# Patient Record
Sex: Female | Born: 1980 | Race: White | Hispanic: No | Marital: Married | Smoking: Never smoker
Health system: Southern US, Community
[De-identification: ages and names within clinical notes are randomized; demographics above are authoritative.]

## PROBLEM LIST (undated history)

## (undated) ENCOUNTER — Inpatient Hospital Stay (HOSPITAL_COMMUNITY): Payer: Self-pay

## (undated) DIAGNOSIS — F419 Anxiety disorder, unspecified: Secondary | ICD-10-CM

## (undated) DIAGNOSIS — R87629 Unspecified abnormal cytological findings in specimens from vagina: Secondary | ICD-10-CM

## (undated) HISTORY — DX: Unspecified abnormal cytological findings in specimens from vagina: R87.629

## (undated) HISTORY — PX: OTHER SURGICAL HISTORY: SHX169

## (undated) HISTORY — PX: DILATION AND CURETTAGE OF UTERUS: SHX78

## (undated) HISTORY — DX: Anxiety disorder, unspecified: F41.9

---

## 2001-08-25 ENCOUNTER — Emergency Department (HOSPITAL_COMMUNITY): Admission: EM | Admit: 2001-08-25 | Discharge: 2001-08-25 | Payer: Self-pay | Admitting: Emergency Medicine

## 2002-02-06 ENCOUNTER — Emergency Department (HOSPITAL_COMMUNITY): Admission: EM | Admit: 2002-02-06 | Discharge: 2002-02-07 | Payer: Self-pay | Admitting: Emergency Medicine

## 2002-02-07 ENCOUNTER — Encounter: Payer: Self-pay | Admitting: Emergency Medicine

## 2002-02-09 ENCOUNTER — Emergency Department (HOSPITAL_COMMUNITY): Admission: EM | Admit: 2002-02-09 | Discharge: 2002-02-09 | Payer: Self-pay | Admitting: *Deleted

## 2002-03-30 ENCOUNTER — Ambulatory Visit (HOSPITAL_COMMUNITY): Admission: RE | Admit: 2002-03-30 | Discharge: 2002-03-30 | Payer: Self-pay

## 2002-04-15 ENCOUNTER — Emergency Department (HOSPITAL_COMMUNITY): Admission: EM | Admit: 2002-04-15 | Discharge: 2002-04-15 | Payer: Self-pay | Admitting: Emergency Medicine

## 2002-11-13 ENCOUNTER — Inpatient Hospital Stay (HOSPITAL_COMMUNITY): Admission: EM | Admit: 2002-11-13 | Discharge: 2002-11-13 | Payer: Self-pay | Admitting: Emergency Medicine

## 2004-03-22 ENCOUNTER — Emergency Department (HOSPITAL_COMMUNITY): Admission: EM | Admit: 2004-03-22 | Discharge: 2004-03-22 | Payer: Self-pay | Admitting: Emergency Medicine

## 2007-01-13 ENCOUNTER — Emergency Department (HOSPITAL_COMMUNITY): Admission: EM | Admit: 2007-01-13 | Discharge: 2007-01-13 | Payer: Self-pay | Admitting: Emergency Medicine

## 2007-02-01 ENCOUNTER — Other Ambulatory Visit: Admission: RE | Admit: 2007-02-01 | Discharge: 2007-02-01 | Payer: Self-pay | Admitting: Gynecology

## 2008-04-23 ENCOUNTER — Emergency Department (HOSPITAL_COMMUNITY): Admission: EM | Admit: 2008-04-23 | Discharge: 2008-04-23 | Payer: Self-pay | Admitting: Emergency Medicine

## 2008-12-06 ENCOUNTER — Emergency Department (HOSPITAL_COMMUNITY): Admission: EM | Admit: 2008-12-06 | Discharge: 2008-12-06 | Payer: Self-pay | Admitting: Family Medicine

## 2009-07-04 ENCOUNTER — Inpatient Hospital Stay (HOSPITAL_COMMUNITY): Admission: AD | Admit: 2009-07-04 | Discharge: 2009-07-05 | Payer: Self-pay | Admitting: Obstetrics and Gynecology

## 2009-11-25 ENCOUNTER — Inpatient Hospital Stay (HOSPITAL_COMMUNITY): Admission: AD | Admit: 2009-11-25 | Discharge: 2009-11-25 | Payer: Self-pay | Admitting: Obstetrics and Gynecology

## 2009-11-28 ENCOUNTER — Inpatient Hospital Stay (HOSPITAL_COMMUNITY): Admission: AD | Admit: 2009-11-28 | Discharge: 2009-11-28 | Payer: Self-pay | Admitting: Obstetrics and Gynecology

## 2010-01-09 ENCOUNTER — Inpatient Hospital Stay (HOSPITAL_COMMUNITY): Admission: RE | Admit: 2010-01-09 | Discharge: 2010-01-11 | Payer: Self-pay | Admitting: Obstetrics and Gynecology

## 2010-12-24 LAB — RPR: RPR Ser Ql: NONREACTIVE

## 2010-12-24 LAB — URINALYSIS, ROUTINE W REFLEX MICROSCOPIC
Bilirubin Urine: NEGATIVE
Bilirubin Urine: NEGATIVE
Glucose, UA: NEGATIVE mg/dL
Glucose, UA: NEGATIVE mg/dL
Hgb urine dipstick: NEGATIVE
Hgb urine dipstick: NEGATIVE
Ketones, ur: NEGATIVE mg/dL
Ketones, ur: NEGATIVE mg/dL
Nitrite: NEGATIVE
Protein, ur: NEGATIVE mg/dL
Protein, ur: NEGATIVE mg/dL
Specific Gravity, Urine: 1.02 (ref 1.005–1.030)
Urobilinogen, UA: 0.2 mg/dL (ref 0.0–1.0)
pH: 6.5 (ref 5.0–8.0)
pH: 6.5 (ref 5.0–8.0)

## 2010-12-24 LAB — CBC
HCT: 28.6 % — ABNORMAL LOW (ref 36.0–46.0)
HCT: 33.7 % — ABNORMAL LOW (ref 36.0–46.0)
Hemoglobin: 11.1 g/dL — ABNORMAL LOW (ref 12.0–15.0)
Hemoglobin: 9.4 g/dL — ABNORMAL LOW (ref 12.0–15.0)
MCHC: 32.8 g/dL (ref 30.0–36.0)
MCHC: 32.9 g/dL (ref 30.0–36.0)
MCV: 79.1 fL (ref 78.0–100.0)
MCV: 79.2 fL (ref 78.0–100.0)
Platelets: 215 10*3/uL (ref 150–400)
Platelets: 259 10*3/uL (ref 150–400)
RBC: 3.62 MIL/uL — ABNORMAL LOW (ref 3.87–5.11)
RBC: 4.26 MIL/uL (ref 3.87–5.11)
RDW: 15.4 % (ref 11.5–15.5)
RDW: 16 % — ABNORMAL HIGH (ref 11.5–15.5)
WBC: 10.7 10*3/uL — ABNORMAL HIGH (ref 4.0–10.5)
WBC: 9.8 10*3/uL (ref 4.0–10.5)

## 2010-12-24 LAB — WET PREP, GENITAL
Clue Cells Wet Prep HPF POC: NONE SEEN
Trich, Wet Prep: NONE SEEN
Yeast Wet Prep HPF POC: NONE SEEN

## 2010-12-24 LAB — STREP B DNA PROBE

## 2010-12-24 LAB — URINE CULTURE: Colony Count: NO GROWTH

## 2010-12-24 LAB — URINE MICROSCOPIC-ADD ON

## 2010-12-24 LAB — FETAL FIBRONECTIN: Fetal Fibronectin: POSITIVE — AB

## 2011-01-09 LAB — WET PREP, GENITAL
Clue Cells Wet Prep HPF POC: NONE SEEN
Trich, Wet Prep: NONE SEEN
Yeast Wet Prep HPF POC: NONE SEEN

## 2011-01-15 LAB — POCT RAPID STREP A (OFFICE): Streptococcus, Group A Screen (Direct): POSITIVE — AB

## 2011-02-20 NOTE — Discharge Summary (Signed)
Destiny Joseph, Destiny Joseph                          ACCOUNT NO.:  [id number]   MEDICAL RECORD NO.:  [id number]                   PATIENT TYPE:  INP   LOCATION:  0154                                 FACILITY:  [facility]   PHYSICIAN:  Lazaro Arms, M.D.        DATE OF BIRTH:  [birth year]   DATE OF ADMISSION:  11/13/2002  DATE OF DISCHARGE:  11/13/2002                                 DISCHARGE SUMMARY   DISCHARGE DIAGNOSES:  1. 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  2. Status post suicide attempt.  3. History of depression.   HISTORY OF PRESENT ILLNESS:  The patient presented to the emergency room on  the morning of November 13, 2002, with nausea, vomiting, and increased  abdominal pain.  This had been progressively worse over the past 40 hours.  In the emergency room she admitted to taking over #30 Tylenol because she  was mad at the world.  She had had this ingestion on the evening of November 10, 2002.  In the emergency room she was hemodynamically stable and alert and  oriented x3; however, her initial laboratory revealed platelets of 56 and a  PT that was greater than 90, and SGOT which was 1713, SGPT was 1680.  Her  pregnancy test was within normal limits.  Her acetaminophen level was less  than 10.   HOSPITAL COURSE:  She was admitted to the intensive care unit and started on  Mucomyst.  We then proceeded to spend greater than one hour arranging her  transport to a facility that has the availability of liver transplant.  This  was finally accomplished, and the patient will be transported to [facility] under the care of Dr. Bobbe Medico.   PROCEDURE:  During her hospitalization included a transfusion of several  units of SFP.  Immediately after transfusion of these, her INR was 1.2.   PAST MEDICAL HISTORY:  1. Depression:  The patient denies any history of prior suicide attempts or     drug use.  2. She has a history of abdominal pain, and  has had an endoscopy in the past     by Dr. Fayrene Fearing L. Edwards, which showed a healing gastric ulcer and     gastroesophageal reflux disease.   MEDICATIONS:  She was on no medicines prior to this.   DISCHARGE PLAN:  1. The patient is to be transferred by ambulance to [facility] under     the care of Dr. Bobbe Medico.  2.     We will continue the Mucomyst q.4h. per protocol.  Much time was spent in discussion with the family, as well as with several  GI specialists.  The parents were made aware of her grave prognosis.  Lazaro Arms, M.D.    AMC/MEDQ  D:  11/13/2002  T:  11/13/2002  Job:  [id number]   cc:   Fayrene Fearing L. Malon Kindle., M.D.  [address]  [place]  [place] [address]  Fax: [phone]   [facility]

## 2011-02-20 NOTE — Procedures (Signed)
[place]  Patient:    Destiny Joseph, CHICO Visit Number: [id number] MRN: [id number]          Service Type: END Location: ENDO Attending Physician:  Orland Mustard Dictated by:   Llana Aliment. Randa Evens, M.D. Proc. Date: 03/30/02 Admit Date:  03/30/2002 Discharge Date: 03/30/2002   CC:         Dr. Selinda Flavin at [facility] in [place], [place]   Procedure Report  DATE OF BIRTH:  Jul 21, 1981  PROCEDURE:  Esophagogastroduodenoscopy with biopsy.  SURGEON:  James L. Randa Evens, M.D.  MEDICATIONS:  Cetacaine spray, fentanyl 50 mcg, and Versed 6 mg IV.  INDICATIONS:  Persistent epigastric pain and going through to the back, negative ultrasound.  The pain has been refractory to Nexium.  DESCRIPTION OF PROCEDURE:  The procedure had been explained to the patient and consent obtained.  With the patient in the left lateral decubitus position, the Olympus scope was inserted, and advanced under direct visualization.  The stomach was entered.  The pylorus identified and passed.  The duodenum was seen well down to the second and third portion and was also normal.  No ulceration or inflammation.  Duodenal bulb normal.  The pyloric channel normal.  The antrum was inflamed with areas of streaky gastritis, and right near the antrum was what appeared to be healing ulcer nearly healed, still somewhat inflamed, but ulcer crater is completely healed over.  A biopsy was taken of the antrum for rapid urease test for Helicobacter.  The fundus and cardia of the stomach was seen well on retroflexed and were normal.  The distal esophagus was somewhat red.  The scope was withdrawn.  The patient tolerated the procedure well.  ASSESSMENT: 1. Gastritis and probable healed gastric ulcer. 2. Probable gastroesophageal reflux disease.  PLAN:  Will continue on Nexium for now.  Will add Reglan 5 mg a.c. and h.s. Will warn about possible jitteriness and sleepiness.   We will give her a reflux sheet and we will see back in the office in three to four weeks. Dictated by:   Llana Aliment. Randa Evens, M.D. Attending Physician:  Orland Mustard DD:  03/30/02 TD:  04/01/02 Job: [id number]

## 2011-07-03 LAB — POCT RAPID STREP A: Streptococcus, Group A Screen (Direct): NEGATIVE

## 2011-09-11 ENCOUNTER — Encounter: Payer: Self-pay | Admitting: *Deleted

## 2011-09-11 ENCOUNTER — Emergency Department (HOSPITAL_BASED_OUTPATIENT_CLINIC_OR_DEPARTMENT_OTHER)
Admission: EM | Admit: 2011-09-11 | Discharge: 2011-09-12 | Disposition: A | Discharge status: Home / Self Care | Payer: 59 | Attending: Emergency Medicine | Admitting: Emergency Medicine

## 2011-09-11 ENCOUNTER — Emergency Department (INDEPENDENT_AMBULATORY_CARE_PROVIDER_SITE_OTHER): Payer: 59

## 2011-09-11 DIAGNOSIS — M7989 Other specified soft tissue disorders: Secondary | ICD-10-CM

## 2011-09-11 DIAGNOSIS — M25549 Pain in joints of unspecified hand: Secondary | ICD-10-CM

## 2011-09-11 DIAGNOSIS — S6710XA Crushing injury of unspecified finger(s), initial encounter: Secondary | ICD-10-CM

## 2011-09-11 DIAGNOSIS — W2209XA Striking against other stationary object, initial encounter: Secondary | ICD-10-CM

## 2011-09-11 DIAGNOSIS — S6010XA Contusion of unspecified finger with damage to nail, initial encounter: Secondary | ICD-10-CM

## 2011-09-11 DIAGNOSIS — IMO0002 Reserved for concepts with insufficient information to code with codable children: Secondary | ICD-10-CM | POA: Insufficient documentation

## 2011-09-11 DIAGNOSIS — S6000XA Contusion of unspecified finger without damage to nail, initial encounter: Secondary | ICD-10-CM

## 2011-09-11 NOTE — ED Notes (Signed)
Pt accidentally slammed her right index finger in the car door tonight. Bruising noted. States pain is severe, which prompted her to come to ER. Taking tylenol/ibuprofen without relief.

## 2011-09-12 MED ORDER — IBUPROFEN 400 MG PO TABS
400.0000 mg | ORAL_TABLET | Freq: Once | ORAL | Status: AC
  Administered 2011-09-12: 400 mg via ORAL
  Filled 2011-09-12: qty 1

## 2011-09-12 MED ORDER — TRAMADOL HCL 50 MG PO TABS: 50.0000 mg | ORAL_TABLET | Freq: Four times a day (QID) | ORAL | Status: AC | PRN

## 2011-09-12 NOTE — ED Notes (Signed)
Dr. Kohut at bedside 

## 2011-09-18 NOTE — ED Provider Notes (Signed)
History    30yf with finger pain. r index finger. Distal aspect. Onset this evening. Acute when slammed in car door. Persistent pain since. Throbbing pain. Constant with no radiation. No numbness or tingling. Denies pain anywhere else. CSN: [id number] Arrival date & time: 09/11/2011 11:37 PM   First MD Initiated Contact with Patient 09/12/11 0226      Chief Complaint  Patient presents with  . Hand Pain    (Consider location/radiation/quality/duration/timing/severity/associated sxs/prior treatment) HPI  History reviewed. No pertinent past medical history.  Past Surgical History  Procedure Date  . Dilation and curettage of uterus     No family history on file.  History  Substance Use Topics  . Smoking status: Never Smoker   . Smokeless tobacco: Not on file  . Alcohol Use: No    OB History    Grav Para Term Preterm Abortions TAB SAB Ect Mult Living                  Review of Systems   Review of symptoms negative unless otherwise noted in HPI.   Allergies  Review of patient's allergies indicates no known allergies.  Home Medications   Current Outpatient Rx  Name Route Sig Dispense Refill  . TRAMADOL HCL 50 MG PO TABS Oral Take 1 tablet (50 mg total) by mouth every 6 (six) hours as needed for pain. Maximum dose= 8 tablets per day 10 tablet 0    BP 126/77  Pulse 58  Temp(Src) 98.2 F (36.8 C) (Oral)  Resp 16  Ht 5\' 8"  (1.727 m)  Wt 230 lb (104.327 kg)  BMI 34.97 kg/m2  SpO2 99%  LMP 09/03/2011  Physical Exam  Nursing note and vitals reviewed. Constitutional: She appears well-developed and well-nourished. No distress.  HENT:  Head: Normocephalic and atraumatic.  Eyes: Conjunctivae are normal. Right eye exhibits no discharge. Left eye exhibits no discharge.  Neck: Neck supple.  Cardiovascular: Normal rate, regular rhythm and normal heart sounds.  Exam reveals no gallop and no friction rub.   No murmur heard. Pulmonary/Chest: Effort normal and breath  sounds normal. No respiratory distress.  Musculoskeletal:       r index finger nail with small subungual hematoma. Moderate diffuse tenderness of distal phalanx. Skin intact. No deformity. Good cap refill distally. Sensation intatact to light touch. Flexor/extensor function against resistance intact at DIP.  Neurological: She is alert.  Skin: Skin is warm and dry.  Psychiatric: She has a normal mood and affect. Her behavior is normal. Thought content normal.    ED Course  Procedures (including critical care time)  Labs Reviewed - No data to display No results found.  Dg Finger Index Right  09/11/2011  *RADIOLOGY REPORT*  Clinical Data: Crush injury to the right index finger, closed in a car door.  RIGHT INDEX FINGER 2+V 09/11/2011:  Comparison: None.  Findings: No evidence of acute or subacute fracture or dislocation. Well-preserved joint spaces.  Well-preserved bone mineral density. No intrinsic osseous abnormalities.  Mild soft tissue swelling.  IMPRESSION: No osseous abnormality.  Original Report Authenticated By: Arnell Sieving, M.D.   1. Contusion, finger   2. Subungual hematoma of finger       MDM  30yF with finger pain after being slammed in car door. Consider fracture, contusion, dislocation. XR neg for acute osseous injury. Small subungal hematoma. Do not feels needs trephination at this time. Plan symptomatic tx and pcp fu.        Raeford Razor, MD 09/18/11  1459 

## 2013-07-23 IMAGING — CR DG FINGER INDEX 2+V*R*
3 series · 3 of 3 positions shown · non-contrast
Comparison: None.

CLINICAL DATA: Crush injury to the right index finger, closed in a
car door.

RIGHT INDEX FINGER 2+V 09/11/2011:

[x finger pa right]
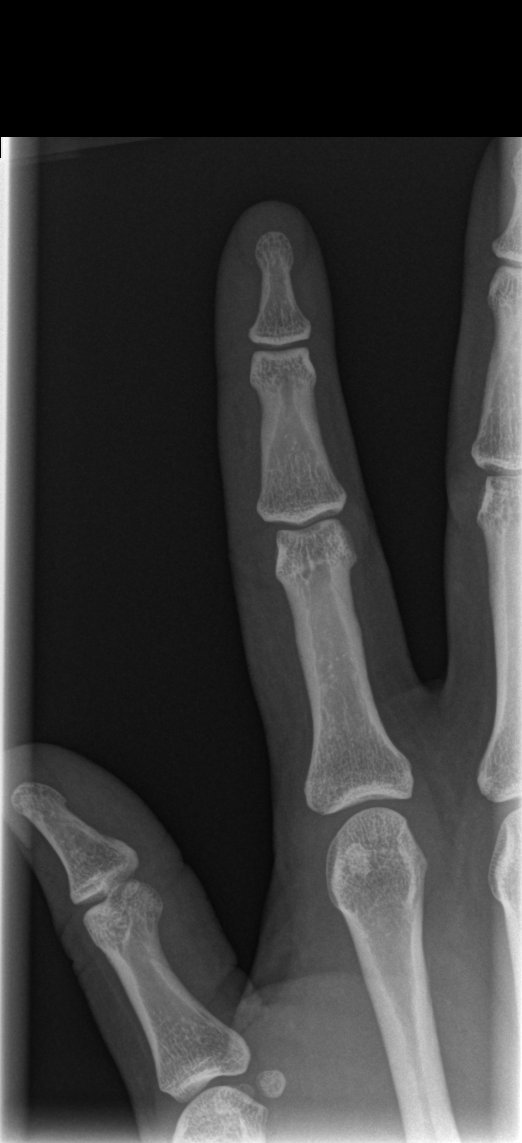

[x finger obl. right]
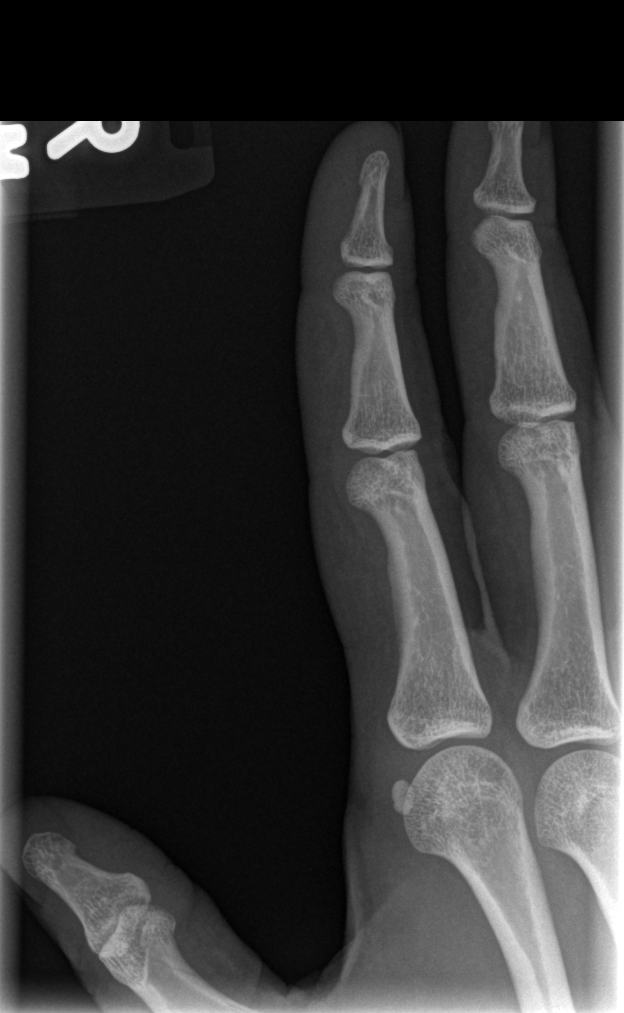

[x finger lateral right]
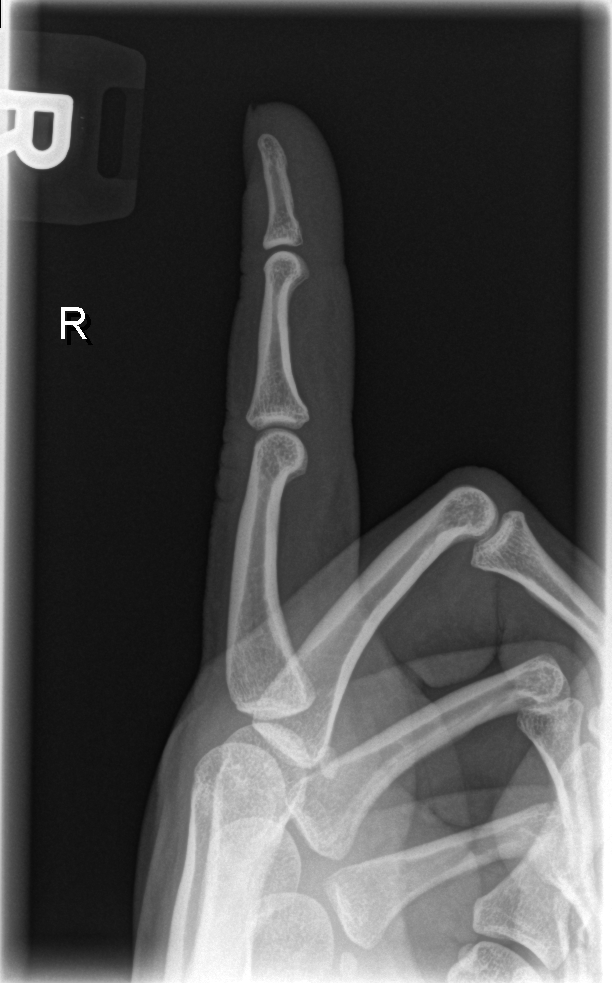

[3 of 3 positions shown; findings below may reference images not displayed]

FINDINGS: No evidence of acute or subacute fracture or dislocation.
Well-preserved joint spaces.  Well-preserved bone mineral density.
No intrinsic osseous abnormalities.  Mild soft tissue swelling.
IMPRESSION: No osseous abnormality.

## 2016-10-05 NOTE — L&D Delivery Note (Signed)
Patient is 36 y.o. [id number] 9095w0d admitted for IOL for macrosomia. S/p IOL with pitocin.  SROM at 0500.    Delivery Note At 5:05 PM a viable female was delivered via Vaginal, Spontaneous Delivery (Presentation: ROA).  APGAR: 8, 9; weight pending.   Placenta status: spontaneous, intact.  Cord: 3 vessel  Anesthesia:  Epidural Episiotomy:  N/A Lacerations:  None Suture Repair: None Est. Blood Loss (mL):  100  Mom to postpartum.  Baby to Couplet care / Skin to Skin.    Upon arrival patient was complete and pushing. She pushed with good maternal effort to deliver a viable female infant in cephalic, ROA position. No nuchal cord present. Baby delivered without difficulty (anterior shoulder delivered with ease), was noted to have good tone and placed on maternal abdomen for oral suctioning, drying and stimulation. Delayed cord clamping performed. Placenta delivered spontaneously with gentle cord traction. Fundus firm with massage and Pitocin. Perineum inspected and found to have no lacerations.  Counts of sharps, instruments, and lap pads were all correct.   Larene BeachMary K Lilibeth Opie, DO PGY-2 06/12/2017, 5:21 PM

## 2016-11-16 ENCOUNTER — Encounter: Payer: Self-pay | Admitting: Family

## 2016-11-16 ENCOUNTER — Other Ambulatory Visit (HOSPITAL_COMMUNITY)
Admission: RE | Admit: 2016-11-16 | Discharge: 2016-11-16 | Disposition: A | Discharge status: Home / Self Care | Payer: Managed Care, Other (non HMO) | Source: Ambulatory Visit | Attending: Family | Admitting: Family

## 2016-11-16 ENCOUNTER — Ambulatory Visit (INDEPENDENT_AMBULATORY_CARE_PROVIDER_SITE_OTHER): Payer: Managed Care, Other (non HMO) | Admitting: Family

## 2016-11-16 VITALS — BP 99/53 | HR 78 | Wt 253.0 lb

## 2016-11-16 DIAGNOSIS — Z1151 Encounter for screening for human papillomavirus (HPV): Secondary | ICD-10-CM | POA: Diagnosis not present

## 2016-11-16 DIAGNOSIS — Z8742 Personal history of other diseases of the female genital tract: Secondary | ICD-10-CM

## 2016-11-16 DIAGNOSIS — Z124 Encounter for screening for malignant neoplasm of cervix: Secondary | ICD-10-CM | POA: Diagnosis not present

## 2016-11-16 DIAGNOSIS — Z283 Underimmunization status: Secondary | ICD-10-CM

## 2016-11-16 DIAGNOSIS — Z87898 Personal history of other specified conditions: Secondary | ICD-10-CM

## 2016-11-16 DIAGNOSIS — O9989 Other specified diseases and conditions complicating pregnancy, childbirth and the puerperium: Secondary | ICD-10-CM

## 2016-11-16 DIAGNOSIS — Z113 Encounter for screening for infections with a predominantly sexual mode of transmission: Secondary | ICD-10-CM | POA: Diagnosis present

## 2016-11-16 DIAGNOSIS — Z3689 Encounter for other specified antenatal screening: Secondary | ICD-10-CM | POA: Diagnosis not present

## 2016-11-16 DIAGNOSIS — O09521 Supervision of elderly multigravida, first trimester: Secondary | ICD-10-CM | POA: Diagnosis not present

## 2016-11-16 DIAGNOSIS — Z01411 Encounter for gynecological examination (general) (routine) with abnormal findings: Secondary | ICD-10-CM | POA: Diagnosis present

## 2016-11-16 DIAGNOSIS — Z3481 Encounter for supervision of other normal pregnancy, first trimester: Secondary | ICD-10-CM

## 2016-11-16 DIAGNOSIS — Z2839 Other underimmunization status: Secondary | ICD-10-CM

## 2016-11-16 DIAGNOSIS — J309 Allergic rhinitis, unspecified: Secondary | ICD-10-CM

## 2016-11-16 DIAGNOSIS — O09529 Supervision of elderly multigravida, unspecified trimester: Secondary | ICD-10-CM | POA: Insufficient documentation

## 2016-11-16 DIAGNOSIS — Z3490 Encounter for supervision of normal pregnancy, unspecified, unspecified trimester: Secondary | ICD-10-CM | POA: Insufficient documentation

## 2016-11-16 DIAGNOSIS — Z3480 Encounter for supervision of other normal pregnancy, unspecified trimester: Secondary | ICD-10-CM

## 2016-11-16 DIAGNOSIS — D069 Carcinoma in situ of cervix, unspecified: Secondary | ICD-10-CM | POA: Insufficient documentation

## 2016-11-16 LAB — HEMOGLOBIN A1C
HEMOGLOBIN A1C: 5 % (ref <5.7)
MEAN PLASMA GLUCOSE: 97 mg/dL

## 2016-11-16 LAB — GLUCOSE, RANDOM: GLUCOSE: 116 mg/dL — AB (ref 65–99)

## 2016-11-16 MED ORDER — LORATADINE 10 MG PO TABS: 10.0000 mg | ORAL_TABLET | Freq: Every day | ORAL | 2 refills | Status: DC

## 2016-11-16 NOTE — Patient Instructions (Signed)
First Trimester of Pregnancy  The first trimester of pregnancy is from week 1 until the end of week 12 (months 1 through 3). A week after a sperm fertilizes an egg, the egg will implant on the wall of the uterus. This embryo will begin to develop into a baby. Genes from you and your partner are forming the baby. The female genes determine whether the baby is a boy or a girl. At 6-8 weeks, the eyes and face are formed, and the heartbeat can be seen on ultrasound. At the end of 12 weeks, all the baby's organs are formed.   Now that you are pregnant, you will want to do everything you can to have a healthy baby. Two of the most important things are to get good prenatal care and to follow your health care provider's instructions. Prenatal care is all the medical care you receive before the baby's birth. This care will help prevent, find, and treat any problems during the pregnancy and childbirth.  BODY CHANGES  Your body goes through many changes during pregnancy. The changes vary from woman to woman.   · You may gain or lose a couple of pounds at first.  · You may feel sick to your stomach (nauseous) and throw up (vomit). If the vomiting is uncontrollable, call your health care provider.  · You may tire easily.  · You may develop headaches that can be relieved by medicines approved by your health care provider.  · You may urinate more often. Painful urination may mean you have a bladder infection.  · You may develop heartburn as a result of your pregnancy.  · You may develop constipation because certain hormones are causing the muscles that push waste through your intestines to slow down.  · You may develop hemorrhoids or swollen, bulging veins (varicose veins).  · Your breasts may begin to grow larger and become tender. Your nipples may stick out more, and the tissue that surrounds them (areola) may become darker.  · Your gums may bleed and may be sensitive to brushing and flossing.   · Dark spots or blotches (chloasma, mask of pregnancy) may develop on your face. This will likely fade after the baby is born.  · Your menstrual periods will stop.  · You may have a loss of appetite.  · You may develop cravings for certain kinds of food.  · You may have changes in your emotions from day to day, such as being excited to be pregnant or being concerned that something may go wrong with the pregnancy and baby.  · You may have more vivid and strange dreams.  · You may have changes in your hair. These can include thickening of your hair, rapid growth, and changes in texture. Some women also have hair loss during or after pregnancy, or hair that feels dry or thin. Your hair will most likely return to normal after your baby is born.  WHAT TO EXPECT AT YOUR PRENATAL VISITS  During a routine prenatal visit:  · You will be weighed to make sure you and the baby are growing normally.  · Your blood pressure will be taken.  · Your abdomen will be measured to track your baby's growth.  · The fetal heartbeat will be listened to starting around week 10 or 12 of your pregnancy.  · Test results from any previous visits will be discussed.  Your health care provider may ask you:  · How you are feeling.  · If you   are feeling the baby move.  · If you have had any abnormal symptoms, such as leaking fluid, bleeding, severe headaches, or abdominal cramping.  · If you are using any tobacco products, including cigarettes, chewing tobacco, and electronic cigarettes.  · If you have any questions.  Other tests that may be performed during your first trimester include:  · Blood tests to find your blood type and to check for the presence of any previous infections. They will also be used to check for low iron levels (anemia) and Rh antibodies. Later in the pregnancy, blood tests for diabetes will be done along with other tests if problems develop.  · Urine tests to check for infections, diabetes, or protein in the urine.   · An ultrasound to confirm the proper growth and development of the baby.  · An amniocentesis to check for possible genetic problems.  · Fetal screens for spina bifida and Down syndrome.  · You may need other tests to make sure you and the baby are doing well.  · HIV (human immunodeficiency virus) testing. Routine prenatal testing includes screening for HIV, unless you choose not to have this test.  HOME CARE INSTRUCTIONS   Medicines  · Follow your health care provider's instructions regarding medicine use. Specific medicines may be either safe or unsafe to take during pregnancy.  · Take your prenatal vitamins as directed.  · If you develop constipation, try taking a stool softener if your health care provider approves.  Diet  · Eat regular, well-balanced meals. Choose a variety of foods, such as meat or vegetable-based protein, fish, milk and low-fat dairy products, vegetables, fruits, and whole grain breads and cereals. Your health care provider will help you determine the amount of weight gain that is right for you.  · Avoid raw meat and uncooked cheese. These carry germs that can cause birth defects in the baby.  · Eating four or five small meals rather than three large meals a day may help relieve nausea and vomiting. If you start to feel nauseous, eating a few soda crackers can be helpful. Drinking liquids between meals instead of during meals also seems to help nausea and vomiting.  · If you develop constipation, eat more high-fiber foods, such as fresh vegetables or fruit and whole grains. Drink enough fluids to keep your urine clear or pale yellow.  Activity and Exercise  · Exercise only as directed by your health care provider. Exercising will help you:    Control your weight.    Stay in shape.    Be prepared for labor and delivery.  · Experiencing pain or cramping in the lower abdomen or low back is a good sign that you should stop exercising. Check with your health care provider  before continuing normal exercises.  · Try to avoid standing for long periods of time. Move your legs often if you must stand in one place for a long time.  · Avoid heavy lifting.  · Wear low-heeled shoes, and practice good posture.  · You may continue to have sex unless your health care provider directs you otherwise.  Relief of Pain or Discomfort  · Wear a good support bra for breast tenderness.    · Take warm sitz baths to soothe any pain or discomfort caused by hemorrhoids. Use hemorrhoid cream if your health care provider approves.    · Rest with your legs elevated if you have leg cramps or low back pain.  · If you develop varicose veins in your   legs, wear support hose. Elevate your feet for 15 minutes, 3-4 times a day. Limit salt in your diet.  Prenatal Care  · Schedule your prenatal visits by the twelfth week of pregnancy. They are usually scheduled monthly at first, then more often in the last 2 months before delivery.  · Write down your questions. Take them to your prenatal visits.  · Keep all your prenatal visits as directed by your health care provider.  Safety  · Wear your seat belt at all times when driving.  · Make a list of emergency phone numbers, including numbers for family, friends, the hospital, and police and fire departments.  General Tips  · Ask your health care provider for a referral to a local prenatal education class. Begin classes no later than at the beginning of month 6 of your pregnancy.  · Ask for help if you have counseling or nutritional needs during pregnancy. Your health care provider can offer advice or refer you to specialists for help with various needs.  · Do not use hot tubs, steam rooms, or saunas.  · Do not douche or use tampons or scented sanitary pads.  · Do not cross your legs for long periods of time.  · Avoid cat litter boxes and soil used by cats. These carry germs that can cause birth defects in the baby and possibly loss of the fetus by miscarriage or stillbirth.   · Avoid all smoking, herbs, alcohol, and medicines not prescribed by your health care provider. Chemicals in these affect the formation and growth of the baby.  · Do not use any tobacco products, including cigarettes, chewing tobacco, and electronic cigarettes. If you need help quitting, ask your health care provider. You may receive counseling support and other resources to help you quit.  · Schedule a dentist appointment. At home, brush your teeth with a soft toothbrush and be gentle when you floss.  SEEK MEDICAL CARE IF:   · You have dizziness.  · You have mild pelvic cramps, pelvic pressure, or nagging pain in the abdominal area.  · You have persistent nausea, vomiting, or diarrhea.  · You have a bad smelling vaginal discharge.  · You have pain with urination.  · You notice increased swelling in your face, hands, legs, or ankles.  SEEK IMMEDIATE MEDICAL CARE IF:   · You have a fever.  · You are leaking fluid from your vagina.  · You have spotting or bleeding from your vagina.  · You have severe abdominal cramping or pain.  · You have rapid weight gain or loss.  · You vomit blood or material that looks like coffee grounds.  · You are exposed to German measles and have never had them.  · You are exposed to fifth disease or chickenpox.  · You develop a severe headache.  · You have shortness of breath.  · You have any kind of trauma, such as from a fall or a car accident.     This information is not intended to replace advice given to you by your health care provider. Make sure you discuss any questions you have with your health care provider.     Document Released: 09/15/2001 Document Revised: 10/12/2014 Document Reviewed: 08/01/2013  Elsevier Interactive Patient Education ©2017 [employer]

## 2016-11-16 NOTE — Progress Notes (Signed)
Bedside U/S shows IUP with FHT of 190 BPM  CRL measures 5037w2d  Pt does need pap today.  H/O abn in last pregnancy
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
## 2016-11-17 LAB — PRENATAL PROFILE (SOLSTAS)
Antibody Screen: NEGATIVE
BASOS ABS: 0 {cells}/uL (ref 0–200)
Basophils Relative: 0 %
EOS ABS: 258 {cells}/uL (ref 15–500)
Eosinophils Relative: 3 %
HCT: 38 % (ref 35.0–45.0)
HEP B S AG: NEGATIVE
HIV: NONREACTIVE
Hemoglobin: 12.3 g/dL (ref 11.7–15.5)
LYMPHS ABS: 1806 {cells}/uL (ref 850–3900)
LYMPHS PCT: 21 %
MCH: 27.1 pg (ref 27.0–33.0)
MCHC: 32.4 g/dL (ref 32.0–36.0)
MCV: 83.7 fL (ref 80.0–100.0)
MONO ABS: 430 {cells}/uL (ref 200–950)
MPV: 9 fL (ref 7.5–12.5)
Monocytes Relative: 5 %
NEUTROS PCT: 71 %
Neutro Abs: 6106 cells/uL (ref 1500–7800)
PLATELETS: 283 10*3/uL (ref 140–400)
RBC: 4.54 MIL/uL (ref 3.80–5.10)
RDW: 14.2 % (ref 11.0–15.0)
RH TYPE: POSITIVE
WBC: 8.6 10*3/uL (ref 3.8–10.8)

## 2016-11-17 LAB — CULTURE, OB URINE: Organism ID, Bacteria: NO GROWTH

## 2016-11-18 LAB — CYTOLOGY - PAP
CHLAMYDIA, DNA PROBE: NEGATIVE
HPV (WINDOPATH): DETECTED — AB
Neisseria Gonorrhea: NEGATIVE

## 2016-11-18 LAB — PAIN MGMT, PROFILE 6 CONF W/O MM, U
6 ACETYLMORPHINE: NEGATIVE ng/mL (ref <10)
Alcohol Metabolites: NEGATIVE ng/mL (ref <500)
Amphetamines: NEGATIVE ng/mL (ref <500)
Barbiturates: NEGATIVE ng/mL (ref <300)
Benzodiazepines: NEGATIVE ng/mL (ref <100)
COCAINE METABOLITE: NEGATIVE ng/mL (ref <150)
CREATININE: 91 mg/dL (ref >=20.0)
METHADONE METABOLITE: NEGATIVE ng/mL (ref <100)
Marijuana Metabolite: NEGATIVE ng/mL (ref <20)
Opiates: NEGATIVE ng/mL (ref <100)
Oxidant: NEGATIVE ug/mL (ref <200)
Oxycodone: NEGATIVE ng/mL (ref <100)
PH: 6.96 (ref 4.5–9.0)
PHENCYCLIDINE: NEGATIVE ng/mL (ref <25)
Please note:: 0

## 2016-11-19 ENCOUNTER — Ambulatory Visit (INDEPENDENT_AMBULATORY_CARE_PROVIDER_SITE_OTHER): Payer: Managed Care, Other (non HMO) | Admitting: Obstetrics & Gynecology

## 2016-11-19 VITALS — BP 110/69 | HR 87 | Wt 253.0 lb

## 2016-11-19 DIAGNOSIS — E669 Obesity, unspecified: Secondary | ICD-10-CM

## 2016-11-19 DIAGNOSIS — O99210 Obesity complicating pregnancy, unspecified trimester: Secondary | ICD-10-CM

## 2016-11-19 DIAGNOSIS — Z9109 Other allergy status, other than to drugs and biological substances: Secondary | ICD-10-CM

## 2016-11-19 DIAGNOSIS — O99211 Obesity complicating pregnancy, first trimester: Secondary | ICD-10-CM

## 2016-11-19 MED ORDER — HYDROXYZINE HCL 25 MG PO TABS: 25.0000 mg | ORAL_TABLET | Freq: Four times a day (QID) | ORAL | 2 refills | Status: DC | PRN

## 2016-11-19 NOTE — Progress Notes (Signed)
Pt is complaining of itching in her mouth, eyes, throat and ears that started on Saturday. Yesterday she felt shortness of breath and like her heart was racing. Itching has worsened since Saturday, shortness of breath/heart racing has improved. She had hives on her face yesterday but they have improved since yesterday.

## 2016-11-19 NOTE — Progress Notes (Signed)
   Subjective:    Patient ID: Destiny Joseph, female    DOB: 03/09/1981, 36 y.o.   MRN: [id number]  HPI  36 yo lady seen here today 3 days after her NOB exam for worsening allergy symptoms. She reports the face/mouth itching for the last 6 days and she can't think of anything different in her life. She has tried benadryl, zyrtec, and claritin. She says that she tried to get an appt with an allergist but was unable to do so.  Review of Systems     Objective:   Physical Exam Pleasant obese WF, NAD Breathing, conversing, and ambulating normally        Assessment & Plan:  Worsening allergies- refer to allergist Atarax prn

## 2016-11-20 DIAGNOSIS — O09899 Supervision of other high risk pregnancies, unspecified trimester: Secondary | ICD-10-CM | POA: Insufficient documentation

## 2016-11-20 DIAGNOSIS — Z283 Underimmunization status: Secondary | ICD-10-CM | POA: Insufficient documentation

## 2016-11-20 DIAGNOSIS — O9989 Other specified diseases and conditions complicating pregnancy, childbirth and the puerperium: Secondary | ICD-10-CM

## 2016-12-14 ENCOUNTER — Ambulatory Visit (INDEPENDENT_AMBULATORY_CARE_PROVIDER_SITE_OTHER): Payer: Managed Care, Other (non HMO) | Admitting: Advanced Practice Midwife

## 2016-12-14 DIAGNOSIS — E669 Obesity, unspecified: Secondary | ICD-10-CM

## 2016-12-14 DIAGNOSIS — O99211 Obesity complicating pregnancy, first trimester: Secondary | ICD-10-CM

## 2016-12-14 DIAGNOSIS — Z8742 Personal history of other diseases of the female genital tract: Secondary | ICD-10-CM

## 2016-12-14 NOTE — Progress Notes (Signed)
Pt will need MD visit next for a colpo

## 2016-12-14 NOTE — Progress Notes (Signed)
   PRENATAL VISIT NOTE  Subjective:  Destiny Joseph is a 36 y.o. [id number] at 3712w2d being seen today for ongoing prenatal care.  She is currently monitored for the following issues for this high-risk pregnancy and has Supervision of normal pregnancy, antepartum; AMA; History of abnormal cervical Pap smear; Obesity in pregnancy; and Rubella non-immune status, antepartum on her problem list.  Patient reports no complaints.   . Vag. Bleeding: None.   . Denies leaking of fluid.   The following portions of the patient's history were reviewed and updated as appropriate: allergies, current medications, past family history, past medical history, past social history, past surgical history and problem list. Problem list updated.  Objective:   Vitals:   12/14/16 0922  BP: 108/69  Pulse: 92  Weight: 258 lb (117 kg)    Fetal Status: Fetal Heart Rate (bpm): 151 Fundal Height: 13 cm       General:  Alert, oriented and cooperative. Patient is in no acute distress.  Skin: Skin is warm and dry. No rash noted.   Cardiovascular: Normal heart rate noted  Respiratory: Normal respiratory effort, no problems with respiration noted  Abdomen: Soft, gravid, appropriate for gestational age.       Pelvic:  Cervical exam deferred        Extremities: Normal range of motion.  Edema: None  Mental Status: Normal mood and affect. Normal behavior. Normal judgment and thought content.   Assessment and Plan:  Pregnancy: [id number] at 4612w2d  1. History of abnormal cervical Pap smear    Discussed pap with Dr Marice Potterove.   ASCUS, cannot exclude High Grade  >> she recommends colpo, will schedule  Preterm labor symptoms and general obstetric precautions including but not limited to vaginal bleeding, contractions, leaking of fluid and fetal movement were reviewed in detail with the patient. Please refer to After Visit Summary for other counseling recommendations.  Return in about 4 weeks (around 01/11/2017[place]  [facility]   Aviva SignsMarie L Taylr Meuth, CNM

## 2016-12-14 NOTE — Patient Instructions (Signed)

## 2016-12-15 ENCOUNTER — Inpatient Hospital Stay (HOSPITAL_COMMUNITY)
Admission: AD | Admit: 2016-12-15 | Discharge: 2016-12-16 | Disposition: A | Discharge status: Home / Self Care | Payer: Managed Care, Other (non HMO) | Source: Ambulatory Visit | Attending: Family Medicine | Admitting: Family Medicine

## 2016-12-15 ENCOUNTER — Encounter (HOSPITAL_COMMUNITY): Payer: Self-pay | Admitting: *Deleted

## 2016-12-15 ENCOUNTER — Telehealth: Payer: Self-pay | Admitting: *Deleted

## 2016-12-15 ENCOUNTER — Telehealth: Payer: Self-pay

## 2016-12-15 DIAGNOSIS — O09899 Supervision of other high risk pregnancies, unspecified trimester: Secondary | ICD-10-CM

## 2016-12-15 DIAGNOSIS — O99511 Diseases of the respiratory system complicating pregnancy, first trimester: Secondary | ICD-10-CM | POA: Insufficient documentation

## 2016-12-15 DIAGNOSIS — O99211 Obesity complicating pregnancy, first trimester: Secondary | ICD-10-CM | POA: Insufficient documentation

## 2016-12-15 DIAGNOSIS — O9989 Other specified diseases and conditions complicating pregnancy, childbirth and the puerperium: Secondary | ICD-10-CM

## 2016-12-15 DIAGNOSIS — O99210 Obesity complicating pregnancy, unspecified trimester: Secondary | ICD-10-CM

## 2016-12-15 DIAGNOSIS — K2900 Acute gastritis without bleeding: Secondary | ICD-10-CM

## 2016-12-15 DIAGNOSIS — Z283 Underimmunization status: Secondary | ICD-10-CM

## 2016-12-15 DIAGNOSIS — Z3A13 13 weeks gestation of pregnancy: Secondary | ICD-10-CM | POA: Insufficient documentation

## 2016-12-15 DIAGNOSIS — Z3480 Encounter for supervision of other normal pregnancy, unspecified trimester: Secondary | ICD-10-CM

## 2016-12-15 DIAGNOSIS — O210 Mild hyperemesis gravidarum: Secondary | ICD-10-CM | POA: Diagnosis present

## 2016-12-15 LAB — URINALYSIS, ROUTINE W REFLEX MICROSCOPIC
Bacteria, UA: NONE SEEN
GLUCOSE, UA: NEGATIVE mg/dL
Hgb urine dipstick: NEGATIVE
KETONES UR: 5 mg/dL — AB
LEUKOCYTES UA: NEGATIVE
Nitrite: NEGATIVE
PH: 5 (ref 5.0–8.0)
Protein, ur: 30 mg/dL — AB
SPECIFIC GRAVITY, URINE: 1.035 — AB (ref 1.005–1.030)

## 2016-12-15 LAB — INFLUENZA PANEL BY PCR (TYPE A & B)
INFLBPCR: NEGATIVE
Influenza A By PCR: NEGATIVE

## 2016-12-15 MED ORDER — LACTATED RINGERS IV BOLUS (SEPSIS)
1000.0000 mL | Freq: Once | INTRAVENOUS | Status: AC
  Administered 2016-12-15: 1000 mL via INTRAVENOUS

## 2016-12-15 MED ORDER — PROMETHAZINE HCL 25 MG/ML IJ SOLN
25.0000 mg | Freq: Four times a day (QID) | INTRAMUSCULAR | Status: DC | PRN
  Administered 2016-12-15: 25 mg via INTRAVENOUS
  Filled 2016-12-15: qty 1

## 2016-12-15 MED ORDER — PROMETHAZINE HCL 25 MG RE SUPP: 25.0000 mg | Freq: Four times a day (QID) | RECTAL | 0 refills | Status: DC | PRN

## 2016-12-15 NOTE — MAU Note (Signed)
PT  SAYS SHE WOKE  AT 4AM  VOMITNG    AND  DIARRHEA,    FEVER-   102.4-     TOOK XS TYLENOL  2 TABS  AT  830PM.   .   [facility] [place]

## 2016-12-15 NOTE — Telephone Encounter (Signed)
Erroneous encounter

## 2016-12-15 NOTE — Telephone Encounter (Signed)
Pt states that she has been nauseous and vomiting since 4:00 am. I told pt that we would send in Phenergan suppositories to try and help her but if she continues to throw up then she would need to go to hospital for fluids because she may be dehydrated or may become dehydrated.

## 2016-12-15 NOTE — MAU Provider Note (Signed)
Patient Destiny Joseph is a 36 year old G4P2012 at 13 weeks and 3 days with complaints of nausea and vomiting and diarrhea since 4 am. She denies vaginal bleeding and  abnormal discharge. Last episode was at noon; she couldn't come in until tonight because her husband works in [place].  History     CSN: [id number]  Arrival date and time: 12/15/16 2120   None     Chief Complaint  Patient presents with  . Emesis   Emesis   This is a new problem. The current episode started today. The problem occurs more than 10 times per day. The problem has been unchanged. The emesis has an appearance of bile and stomach contents. Maximum temperature: reports fever of 102 at home. Associated symptoms include abdominal pain. Pertinent negatives include no arthralgias, chest pain, chills, coughing, diarrhea, dizziness, fever, headaches, myalgias, sweats, URI or weight loss. She has tried acetaminophen for the symptoms. The treatment provided no relief.  Diarrhea   This is a new problem. The current episode started today. The problem occurs more than 10 times per day. The problem has been unchanged. The stool consistency is described as watery. Associated symptoms include abdominal pain and vomiting. Pertinent negatives include no arthralgias, chills, coughing, fever, headaches, myalgias, sweats, URI or weight loss. There are no known risk factors. She has tried nothing for the symptoms.    OB History    Gravida Para Term Preterm AB Living   4 2 2   1 2    SAB TAB Ectopic Multiple Live Births   1       2      Past Medical History:  Diagnosis Date  . Anxiety   . Vaginal Pap smear, abnormal    Required cryotherapy    Past Surgical History:  Procedure Laterality Date  . DILATION AND CURETTAGE OF UTERUS    . foot cyst removal Left     Family History  Problem Relation Age of Onset  . Breast cancer Mother [id number]  . Ovarian cysts Mother   . Hypertension Father   . Diabetes Father   . Bipolar disorder  Sister   . Breast cancer Maternal Aunt 36    Social History  Substance Use Topics  . Smoking status: Never Smoker  . Smokeless tobacco: Never Used  . Alcohol use No    Allergies: No Known Allergies  Prescriptions Prior to Admission  Medication Sig Dispense Refill Last Dose  . hydrOXYzine (ATARAX/VISTARIL) 25 MG tablet Take 1 tablet (25 mg total) by mouth every 6 (six) hours as needed for itching. 30 tablet 2 Taking  . loratadine (CLARITIN) 10 MG tablet Take 1 tablet (10 mg total) by mouth daily. 30 tablet 2 Taking  . promethazine (PHENERGAN) 25 MG suppository Place 1 suppository (25 mg total) rectally every 6 (six) hours as needed for nausea or vomiting. 12 each 0     Review of Systems  Constitutional: Negative for chills, fever and weight loss.  Eyes: Negative.   Respiratory: Negative for cough.   Cardiovascular: Negative for chest pain.  Gastrointestinal: Positive for abdominal pain and vomiting. Negative for diarrhea.  Endocrine: Negative.   Genitourinary: Negative.   Musculoskeletal: Negative for arthralgias and myalgias.  Neurological: Negative for dizziness and headaches.   Physical Exam   Blood pressure 108/61, pulse (!) 140, temperature 99.8 F (37.7 C), temperature source Oral, resp. rate 20, weight 116.7 kg (257 lb 4 oz), last menstrual period 09/12/2016.  Physical Exam  Constitutional: She is  oriented to person, place, and time. She appears well-developed and well-nourished.  Eyes: Pupils are equal, round, and reactive to light.  Neck: Normal range of motion.  Respiratory: Effort normal. No respiratory distress. She has no wheezes. She has no rales. She exhibits no tenderness.  GI: Soft. She exhibits no distension and no mass. There is no tenderness. There is no rebound and no guarding.  hypoactive  Musculoskeletal: Normal range of motion.  Neurological: She is alert and oriented to person, place, and time. She has normal reflexes.  Skin: Skin is warm and dry.   Psychiatric: She has a normal mood and affect.    MAU Course  Procedures  MDM -CBC -CMP -IV phenergan and fluids -EKG normal -FHR doppler 174 Assessment and Plan   1. Acute gastritis without hemorrhage, unspecified gastritis type   2. Rubella non-immune status, antepartum   3. Obesity in pregnancy   4. Supervision of other normal pregnancy, antepartum    2. Patient feels better; patient stable for discharge home with comfort measures and recommendations to return to the MAU if her condition is not improved in 24 hours.  3. Recommended BRAT diet and rest 4. Patient verbalized understanding.   Charlesetta Garibaldi Kyion Gautier CNM 12/15/2016, 10:52 PM

## 2016-12-16 DIAGNOSIS — K2900 Acute gastritis without bleeding: Secondary | ICD-10-CM

## 2016-12-16 LAB — COMPREHENSIVE METABOLIC PANEL
ALT: 28 U/L (ref 14–54)
ANION GAP: 14 (ref 5–15)
AST: 44 U/L — ABNORMAL HIGH (ref 15–41)
Albumin: 3.3 g/dL — ABNORMAL LOW (ref 3.5–5.0)
Alkaline Phosphatase: 89 U/L (ref 38–126)
BUN: 14 mg/dL (ref 6–20)
CALCIUM: 8.3 mg/dL — AB (ref 8.9–10.3)
CHLORIDE: 106 mmol/L (ref 101–111)
CO2: 14 mmol/L — AB (ref 22–32)
Creatinine, Ser: 0.8 mg/dL (ref 0.44–1.00)
GFR calc non Af Amer: >60 mL/min (ref >60)
Glucose, Bld: 160 mg/dL — ABNORMAL HIGH (ref 65–99)
Potassium: 4.3 mmol/L (ref 3.5–5.1)
SODIUM: 134 mmol/L — AB (ref 135–145)
Total Bilirubin: 1.2 mg/dL (ref 0.3–1.2)
Total Protein: 6.9 g/dL (ref 6.5–8.1)

## 2016-12-16 LAB — CBC
HCT: 40.8 % (ref 36.0–46.0)
Hemoglobin: 13.3 g/dL (ref 12.0–15.0)
MCH: 27.2 pg (ref 26.0–34.0)
MCHC: 32.6 g/dL (ref 30.0–36.0)
MCV: 83.4 fL (ref 78.0–100.0)
PLATELETS: 240 10*3/uL (ref 150–400)
RBC: 4.89 MIL/uL (ref 3.87–5.11)
RDW: 14.4 % (ref 11.5–15.5)
WBC: 8.3 10*3/uL (ref 4.0–10.5)

## 2016-12-16 NOTE — Discharge Instructions (Signed)

## 2017-01-11 ENCOUNTER — Ambulatory Visit (INDEPENDENT_AMBULATORY_CARE_PROVIDER_SITE_OTHER): Payer: 59 | Admitting: Obstetrics & Gynecology

## 2017-01-11 ENCOUNTER — Encounter: Payer: Self-pay | Admitting: Obstetrics & Gynecology

## 2017-01-11 VITALS — BP 98/57 | HR 72 | Ht 69.0 in | Wt 264.0 lb

## 2017-01-11 DIAGNOSIS — R87611 Atypical squamous cells cannot exclude high grade squamous intraepithelial lesion on cytologic smear of cervix (ASC-H): Secondary | ICD-10-CM

## 2017-01-11 DIAGNOSIS — B977 Papillomavirus as the cause of diseases classified elsewhere: Secondary | ICD-10-CM

## 2017-01-11 DIAGNOSIS — Z3482 Encounter for supervision of other normal pregnancy, second trimester: Secondary | ICD-10-CM

## 2017-01-11 DIAGNOSIS — Z3480 Encounter for supervision of other normal pregnancy, unspecified trimester: Secondary | ICD-10-CM

## 2017-01-11 DIAGNOSIS — O99210 Obesity complicating pregnancy, unspecified trimester: Secondary | ICD-10-CM

## 2017-01-11 NOTE — Progress Notes (Signed)
   Subjective:    Patient ID: Destiny Joseph, female    DOB: [birth year], 36 y.o.   MRN: [id number]  HPI 36 yo with a ASCUS, + HR HPV, cannot rule out HGSIL. She is [redacted] weeks pregnant and has no complaints   Review of Systems     Objective:   Physical Exam Morbidly obese pleasant WFNAD UPT negative, consent signed, time out done Cervix prepped with acetic acid. Transformation zone seen in its entirety. Colpo adequate. Acetowhite lesion seen in a circumferential pattern around the cervix I did a biopsy at the 7 o'clock position, at the densest of the acetowhite lesion I applied silver nitrate and achieved hemostasis She tolerated the procedure well.      Assessment & Plan:  Anatomy u/s ordered for [redacted] weeks EGA Quad screen today as she has a FH of Down's syndrome Await cervical biopsy

## 2017-01-12 LAB — AFP, QUAD SCREEN
AFP: 34.2 ng/mL
Age Alone: 1:265 {titer}
Curr Gest Age: 17.4 weeks
Down Syndrome Scr Risk Est: 1:7900 {titer}
HCG, Total: 20.9 IU/mL
INH: 161 pg/mL
Interpretation-AFP: NEGATIVE
MOM FOR HCG: 1
MoM for AFP: 1.22
MoM for INH: 1.44
Open Spina bifida: NEGATIVE
TRI 18 SCR RISK EST: NEGATIVE
UE3 VALUE: 1.45 ng/mL
uE3 Mom: 1.54

## 2017-01-26 ENCOUNTER — Ambulatory Visit (HOSPITAL_COMMUNITY)
Admission: RE | Admit: 2017-01-26 | Discharge: 2017-01-26 | Disposition: A | Discharge status: Home / Self Care | Payer: 59 | Source: Ambulatory Visit | Attending: Obstetrics & Gynecology | Admitting: Obstetrics & Gynecology

## 2017-01-26 ENCOUNTER — Other Ambulatory Visit: Payer: Self-pay | Admitting: Obstetrics & Gynecology

## 2017-01-26 DIAGNOSIS — Z3689 Encounter for other specified antenatal screening: Secondary | ICD-10-CM | POA: Diagnosis not present

## 2017-01-26 DIAGNOSIS — O09512 Supervision of elderly primigravida, second trimester: Secondary | ICD-10-CM

## 2017-01-26 DIAGNOSIS — Z3A19 19 weeks gestation of pregnancy: Secondary | ICD-10-CM | POA: Diagnosis not present

## 2017-01-26 DIAGNOSIS — Z3480 Encounter for supervision of other normal pregnancy, unspecified trimester: Secondary | ICD-10-CM

## 2017-01-26 DIAGNOSIS — O09522 Supervision of elderly multigravida, second trimester: Secondary | ICD-10-CM | POA: Insufficient documentation

## 2017-01-26 DIAGNOSIS — O99212 Obesity complicating pregnancy, second trimester: Secondary | ICD-10-CM | POA: Insufficient documentation

## 2017-02-08 ENCOUNTER — Ambulatory Visit (INDEPENDENT_AMBULATORY_CARE_PROVIDER_SITE_OTHER): Payer: 59 | Admitting: Advanced Practice Midwife

## 2017-02-08 VITALS — BP 103/57 | HR 81 | Wt 268.0 lb

## 2017-02-08 DIAGNOSIS — Z87898 Personal history of other specified conditions: Secondary | ICD-10-CM

## 2017-02-08 DIAGNOSIS — IMO0002 Reserved for concepts with insufficient information to code with codable children: Secondary | ICD-10-CM

## 2017-02-08 DIAGNOSIS — Z8742 Personal history of other diseases of the female genital tract: Secondary | ICD-10-CM

## 2017-02-08 DIAGNOSIS — O26892 Other specified pregnancy related conditions, second trimester: Secondary | ICD-10-CM

## 2017-02-08 DIAGNOSIS — R102 Pelvic and perineal pain: Secondary | ICD-10-CM

## 2017-02-08 DIAGNOSIS — N949 Unspecified condition associated with female genital organs and menstrual cycle: Secondary | ICD-10-CM

## 2017-02-08 DIAGNOSIS — Z3A24 24 weeks gestation of pregnancy: Secondary | ICD-10-CM

## 2017-02-08 DIAGNOSIS — Z0489 Encounter for examination and observation for other specified reasons: Secondary | ICD-10-CM

## 2017-02-08 DIAGNOSIS — O09522 Supervision of elderly multigravida, second trimester: Secondary | ICD-10-CM

## 2017-02-08 NOTE — Progress Notes (Signed)
Blood-small 

## 2017-02-08 NOTE — Progress Notes (Signed)
   PRENATAL VISIT NOTE  Subjective:  Destiny Joseph is a 36 y.o. [id number] at 232w2d being seen today for ongoing prenatal care.  She is currently monitored for the following issues for this high-risk pregnancy and has Supervision of normal pregnancy, antepartum; AMA; History of abnormal cervical Pap smear; Obesity in pregnancy; Rubella non-immune status, antepartum; Pap smear of cervix with ASCUS, cannot exclude HGSIL; and High risk HPV infection on her problem list.  Patient reports Groin pains more intense at this point in pregnancy than previous pregnancies,. Doesn't think she is having contractions. Denies urinary, GI complaints.  Contractions: Not present. Vag. Bleeding: None.  Movement: Present. Denies leaking of fluid.   The following portions of the patient's history were reviewed and updated as appropriate: allergies, current medications, past family history, past medical history, past social history, past surgical history and problem list. Problem list updated.  Objective:   Vitals:   02/08/17 1020  BP: (!) 103/57  Pulse: 81  Weight: 268 lb (121.6 kg)    Fetal Status: Fetal Heart Rate (bpm): 137 Fundal Height: 22 cm Movement: Present     General:  Alert, oriented and cooperative. Patient is in no acute distress.  Skin: Skin is warm and dry. No rash noted.   Cardiovascular: Normal heart rate noted  Respiratory: Normal respiratory effort, no problems with respiration noted  Abdomen: Soft, gravid, appropriate for gestational age. Pain/Pressure: Absent     Pelvic:  Cervical exam offered, declined        Extremities: Normal range of motion.  Edema: None  Mental Status: Normal mood and affect. Normal behavior. Normal judgment and thought content.   Assessment and Plan:  Pregnancy: [id number] at 102w2d  1. [redacted] weeks gestation of pregnancy  - US MFM OB FOLLOW UP; Future  2. Evaluate anatomy not seen on prior sonogram  - US MFM OB FOLLOW UP; Future  3. Elderly multigravida in  second trimester  - US MFM OB FOLLOW UP; Future  Preterm labor symptoms and general obstetric precautions including but not limited to vaginal bleeding, contractions, leaking of fluid and fetal movement were reviewed in detail with the patient. Please refer to After Visit Summary for other counseling recommendations.  Increase fluids and rest. Urine culture sent Return in about 4 weeks (around 03/08/2017) for [facility].   Katrinka BlazingSmith, [place], CNM

## 2017-02-08 NOTE — Patient Instructions (Signed)
. °Preterm Labor and Birth Information °The normal length of a pregnancy is 39-41 weeks. Preterm labor is when labor starts before 37 completed weeks of pregnancy. °What are the risk factors for preterm labor? °Preterm labor is more likely to occur in women who: °· Have certain infections during pregnancy such as a bladder infection, sexually transmitted infection, or infection inside the uterus (chorioamnionitis). °· Have a shorter-than-normal cervix. °· Have gone into preterm labor before. °· Have had surgery on their cervix. °· Are younger than age 17 or older than age 35. °· Are African American. °· Are pregnant with twins or multiple babies (multiple gestation). °· Take street drugs or smoke while pregnant. °· Do not gain enough weight while pregnant. °· Became pregnant shortly after having been pregnant. °What are the symptoms of preterm labor? °Symptoms of preterm labor include: °· Cramps similar to those that can happen during a menstrual period. The cramps may happen with diarrhea. °· Pain in the abdomen or lower back. °· Regular uterine contractions that may feel like tightening of the abdomen. °· A feeling of increased pressure in the pelvis. °· Increased watery or bloody mucus discharge from the vagina. °· Water breaking (ruptured amniotic sac). °Why is it important to recognize signs of preterm labor? °It is important to recognize signs of preterm labor because babies who are born prematurely may not be fully developed. This can put them at an increased risk for: °· Long-term (chronic) heart and lung problems. °· Difficulty immediately after birth with regulating body systems, including blood sugar, body temperature, heart rate, and breathing rate. °· Bleeding in the brain. °· Cerebral palsy. °· Learning difficulties. °· Death. °These risks are highest for babies who are born before 34 weeks of pregnancy. °How is preterm labor treated? °Treatment depends on the length of your pregnancy, your condition,  and the health of your baby. It may involve: °· Having a stitch (suture) placed in your cervix to prevent your cervix from opening too early (cerclage). °· Taking or being given medicines, such as: °¨ Hormone medicines. These may be given early in pregnancy to help support the pregnancy. °¨ Medicine to stop contractions. °¨ Medicines to help mature the baby’s lungs. These may be prescribed if the risk of delivery is high. °¨ Medicines to prevent your baby from developing cerebral palsy. °If the labor happens before 34 weeks of pregnancy, you may need to stay in the hospital. °What should I do if I think I am in preterm labor? °If you think that you are going into preterm labor, call your health care provider right away. °How can I prevent preterm labor in future pregnancies? °To increase your chance of having a full-term pregnancy: °· Do not use any tobacco products, such as cigarettes, chewing tobacco, and e-cigarettes. If you need help quitting, ask your health care provider. °· Do not use street drugs or medicines that have not been prescribed to you during your pregnancy. °· Talk with your health care provider before taking any herbal supplements, even if you have been taking them regularly. °· Make sure you gain a healthy amount of weight during your pregnancy. °· Watch for infection. If you think that you might have an infection, get it checked right away. °· Make sure to tell your health care provider if you have gone into preterm labor before. °This information is not intended to replace advice given to you by your health care provider. Make sure you discuss any questions you have with your   health care provider. °Document Released: 12/12/2003 Document Revised: 03/03/2016 Document Reviewed: 02/12/2016 °Elsevier Interactive Patient Education © 2017 Elsevier Inc. ° °

## 2017-02-10 LAB — CULTURE, OB URINE

## 2017-02-21 ENCOUNTER — Other Ambulatory Visit: Payer: Self-pay | Admitting: Advanced Practice Midwife

## 2017-02-21 DIAGNOSIS — O2340 Unspecified infection of urinary tract in pregnancy, unspecified trimester: Secondary | ICD-10-CM

## 2017-02-21 DIAGNOSIS — O2342 Unspecified infection of urinary tract in pregnancy, second trimester: Principal | ICD-10-CM

## 2017-02-21 DIAGNOSIS — B951 Streptococcus, group B, as the cause of diseases classified elsewhere: Secondary | ICD-10-CM | POA: Insufficient documentation

## 2017-02-21 MED ORDER — AMOXICILLIN 875 MG PO TABS: 875.0000 mg | ORAL_TABLET | Freq: Two times a day (BID) | ORAL | 0 refills | Status: DC

## 2017-02-21 NOTE — Progress Notes (Signed)
GBS UTI. Rx Amox. PCN in labor.

## 2017-02-22 ENCOUNTER — Telehealth: Payer: Self-pay | Admitting: *Deleted

## 2017-02-22 NOTE — Telephone Encounter (Signed)
Pt called in stating pharmacy called to let her know she had Rx ready for pick up and she was unsure why. Advised pt that she had GBS UTI and Rx was sent to pharmacy and she would still receive abx at L&D. Pt was upset that she had not received a call from us and had to learn about this from her pharmacy. Advised pt that meds were just sent in yesterday and she was on the list to call today. Pt expressed understanding.

## 2017-03-04 ENCOUNTER — Ambulatory Visit (HOSPITAL_COMMUNITY)
Admission: RE | Admit: 2017-03-04 | Discharge: 2017-03-04 | Disposition: A | Discharge status: Home / Self Care | Payer: 59 | Source: Ambulatory Visit | Attending: Advanced Practice Midwife | Admitting: Advanced Practice Midwife

## 2017-03-04 DIAGNOSIS — Z362 Encounter for other antenatal screening follow-up: Secondary | ICD-10-CM | POA: Diagnosis not present

## 2017-03-04 DIAGNOSIS — Z3A24 24 weeks gestation of pregnancy: Secondary | ICD-10-CM | POA: Diagnosis not present

## 2017-03-04 DIAGNOSIS — Z6839 Body mass index (BMI) 39.0-39.9, adult: Secondary | ICD-10-CM | POA: Insufficient documentation

## 2017-03-04 DIAGNOSIS — Z0489 Encounter for examination and observation for other specified reasons: Secondary | ICD-10-CM

## 2017-03-04 DIAGNOSIS — O99212 Obesity complicating pregnancy, second trimester: Secondary | ICD-10-CM | POA: Insufficient documentation

## 2017-03-04 DIAGNOSIS — IMO0002 Reserved for concepts with insufficient information to code with codable children: Secondary | ICD-10-CM

## 2017-03-04 DIAGNOSIS — O09522 Supervision of elderly multigravida, second trimester: Secondary | ICD-10-CM | POA: Diagnosis not present

## 2017-03-08 ENCOUNTER — Ambulatory Visit (INDEPENDENT_AMBULATORY_CARE_PROVIDER_SITE_OTHER): Payer: 59 | Admitting: Advanced Practice Midwife

## 2017-03-08 VITALS — BP 111/55 | HR 72 | Wt 278.0 lb

## 2017-03-08 DIAGNOSIS — Z3480 Encounter for supervision of other normal pregnancy, unspecified trimester: Secondary | ICD-10-CM

## 2017-03-08 DIAGNOSIS — R8271 Bacteriuria: Secondary | ICD-10-CM

## 2017-03-08 DIAGNOSIS — Z3482 Encounter for supervision of other normal pregnancy, second trimester: Secondary | ICD-10-CM

## 2017-03-10 ENCOUNTER — Encounter: Payer: Self-pay | Admitting: Advanced Practice Midwife

## 2017-03-10 NOTE — Patient Instructions (Signed)
Group B Streptococcus Infection During Pregnancy Group B Streptococcus (GBS) is a type of bacteria (Streptococcus agalactiae) that is often found in healthy people, commonly in the rectum, vagina, and intestines. In people who are healthy and not pregnant, the bacteria rarely cause serious illness or complications. However, women who test positive for GBS during pregnancy can pass the bacteria to their baby during childbirth, which can cause serious infection in the baby after birth. Women with GBS may also have infections during their pregnancy or immediately after childbirth, such as such as urinary tract infections (UTIs) or infections of the uterus (uterine infections). Having GBS also increases a woman's risk of complications during pregnancy, such as early (preterm) labor or delivery, miscarriage, or stillbirth. Routine testing (screening) for GBS is recommended for all pregnant women. What increases the risk? You may have a higher risk for GBS infection during pregnancy if you had one during a past pregnancy. What are the signs or symptoms? In most cases, GBS infection does not cause symptoms in pregnant women. Signs and symptoms of a possible GBS-related infection may include:  Labor starting before the 37th week of pregnancy.  A UTI or bladder infection, which may cause: ? Fever. ? Pain or burning during urination. ? Frequent urination.  Fever during labor, along with: ? Bad-smelling discharge. ? Uterine tenderness. ? Rapid heartbeat in the mother, baby, or both.  Rare but serious symptoms of a possible GBS-related infection in women include:  Blood infection (septicemia). This may cause fever, chills, or confusion.  Lung infection (pneumonia). This may cause fever, chills, cough, rapid breathing, difficulty breathing, or chest pain.  Bone, joint, skin, or soft tissue infection.  How is this diagnosed? You may be screened for GBS between week 35 and week 37 of your pregnancy. If  you have symptoms of preterm labor, you may be screened earlier. This condition is diagnosed based on lab test results from:  A swab of fluid from the vagina and rectum.  A urine sample.  How is this treated? This condition is treated with antibiotic medicine. When you go into labor, or as soon as your water breaks (your membranes rupture), you will be given antibiotics through an IV tube. Antibiotics will continue until after you give birth. If you are having a cesarean delivery, you do not need antibiotics unless your membranes have already ruptured. Follow these instructions at home:  Take over-the-counter and prescription medicines only as told by your health care provider.  Take your antibiotic medicine as told by your health care provider. Do not stop taking the antibiotic even if you start to feel better.  Keep all pre-birth (prenatal) visits and follow-up visits as told by your health care provider. This is important. Contact a health care provider if:  You have pain or burning when you urinate.  You have to urinate frequently.  You have a fever or chills.  You develop a bad-smelling vaginal discharge. Get help right away if:  Your membranes rupture.  You go into labor.  You have severe pain in your abdomen.  You have difficulty breathing.  You have chest pain. This information is not intended to replace advice given to you by your health care provider. Make sure you discuss any questions you have with your health care provider. Document Released: 12/29/2007 Document Revised: 04/17/2016 Document Reviewed: 04/16/2016 Elsevier Interactive Patient Education  2018 Elsevier Inc.  

## 2017-03-10 NOTE — Progress Notes (Signed)
   PRENATAL VISIT NOTE  Subjective:  Destiny Joseph is a 36 y.o. [id number] at 932w4d being seen today for ongoing prenatal care.  She is currently monitored for the following issues for this low-risk pregnancy and has Supervision of normal pregnancy, antepartum; AMA; History of abnormal cervical Pap smear; Obesity in pregnancy; Rubella non-immune status, antepartum; Pap smear of cervix with ASCUS, cannot exclude HGSIL; High risk HPV infection; and Group B streptococcus urinary tract infection affecting pregnancy on her problem list.  Patient reports no complaints.  Contractions: Irritability. Vag. Bleeding: None.  Movement: Present. Denies leaking of fluid.   The following portions of the patient's history were reviewed and updated as appropriate: allergies, current medications, past family history, past medical history, past social history, past surgical history and problem list. Problem list updated.  Is upset that she did not get a call about her Urine culture results.  Discussed issues, and reviewed B GBS bacteruria implications  Objective:   Vitals:   03/08/17 0947  BP: (!) 111/55  Pulse: 72  Weight: 278 lb (126.1 kg)    Fetal Status: Fetal Heart Rate (bpm): 156   Movement: Present     General:  Alert, oriented and cooperative. Patient is in no acute distress.  Skin: Skin is warm and dry. No rash noted.   Cardiovascular: Normal heart rate noted  Respiratory: Normal respiratory effort, no problems with respiration noted  Abdomen: Soft, gravid, appropriate for gestational age. Pain/Pressure: Absent     Pelvic:  Cervical exam deferred        Extremities: Normal range of motion.  Edema: Trace  Mental Status: Normal mood and affect. Normal behavior. Normal judgment and thought content.   Assessment and Plan:  Pregnancy: [id number] at 5432w4d  There are no diagnoses linked to this encounter. Preterm labor symptoms and general obstetric precautions including but not limited to vaginal  bleeding, contractions, leaking of fluid and fetal movement were reviewed in detail with the patient. Please refer to After Visit Summary for other counseling recommendations.   Treat for GBS in labor  RTO  3 weeks for Glucose Tolerance test  Wynelle BourgeoisMarie Kamsiyochukwu Spickler, CNM

## 2017-03-31 ENCOUNTER — Ambulatory Visit (INDEPENDENT_AMBULATORY_CARE_PROVIDER_SITE_OTHER): Payer: 59 | Admitting: Obstetrics & Gynecology

## 2017-03-31 VITALS — BP 99/56 | HR 72 | Wt 281.0 lb

## 2017-03-31 DIAGNOSIS — Z3483 Encounter for supervision of other normal pregnancy, third trimester: Secondary | ICD-10-CM

## 2017-03-31 DIAGNOSIS — Z23 Encounter for immunization: Secondary | ICD-10-CM | POA: Diagnosis not present

## 2017-03-31 DIAGNOSIS — Z3480 Encounter for supervision of other normal pregnancy, unspecified trimester: Secondary | ICD-10-CM

## 2017-03-31 DIAGNOSIS — O26843 Uterine size-date discrepancy, third trimester: Secondary | ICD-10-CM

## 2017-03-31 LAB — CBC
HEMATOCRIT: 34.1 % — AB (ref 35.0–45.0)
Hemoglobin: 11 g/dL — ABNORMAL LOW (ref 11.7–15.5)
MCH: 26.6 pg — AB (ref 27.0–33.0)
MCHC: 32.3 g/dL (ref 32.0–36.0)
MCV: 82.4 fL (ref 80.0–100.0)
MPV: 9.2 fL (ref 7.5–12.5)
PLATELETS: 287 10*3/uL (ref 140–400)
RBC: 4.14 MIL/uL (ref 3.80–5.10)
RDW: 14.2 % (ref 11.0–15.0)
WBC: 8.2 10*3/uL (ref 3.8–10.8)

## 2017-03-31 NOTE — Progress Notes (Signed)
   PRENATAL VISIT NOTE  Subjective:  Macayla Ellison Hughs Sanderford is a 36 y.o. [id number] at 3615w4d being seen today for ongoing prenatal care.  She is currently monitored for the following issues for this high-risk pregnancy and has Supervision of normal pregnancy, antepartum; AMA; History of abnormal cervical Pap smear; Obesity in pregnancy; Rubella non-immune status, antepartum; Pap smear of cervix with ASCUS, cannot exclude HGSIL; High risk HPV infection; and Group B streptococcus urinary tract infection affecting pregnancy on her problem list.  Patient reports no complaints.  Contractions: Irritability. Vag. Bleeding: None.  Movement: Present. Denies leaking of fluid.   The following portions of the patient's history were reviewed and updated as appropriate: allergies, current medications, past family history, past medical history, past social history, past surgical history and problem list. Problem list updated.  Objective:   Vitals:   03/31/17 0803  BP: (!) 99/56  Pulse: 72  Weight: 281 lb (127.5 kg)    Fetal Status: Fetal Heart Rate (bpm): 144 Fundal Height: 34 cm Movement: Present     General:  Alert, oriented and cooperative. Patient is in no acute distress.  Skin: Skin is warm and dry. No rash noted.   Cardiovascular: Normal heart rate noted  Respiratory: Normal respiratory effort, no problems with respiration noted  Abdomen: Soft, gravid, appropriate for gestational age. Pain/Pressure: Absent     Pelvic:  Cervical exam deferred        Extremities: Normal range of motion.  Edema: Trace  Mental Status: Normal mood and affect. Normal behavior. Normal judgment and thought content.   Assessment and Plan:  Pregnancy: [id number] at 4915w4d  1. Encounter for supervision of other normal pregnancy in third trimester - 2Hr GTT w/ 1 Hr Carpenter 75 g - CBC - HIV antibody (with reflex) - RPR - Tdap vaccine greater than or equal to 7yo IM  2. Supervision of other normal pregnancy, antepartum 30  [ound weight gain--advised decreasing food intake and increasing walking / swimming.  3. Uterine size-date discrepancy in third trimester - US MFM OB FOLLOW UP; Future  Preterm labor symptoms and general obstetric precautions including but not limited to vaginal bleeding, contractions, leaking of fluid and fetal movement were reviewed in detail with the patient. Please refer to After Visit Summary for other counseling recommendations.  Return in about 2 weeks (around 04/14/2017).   Elsie LincolnKelly Leggett, MD

## 2017-04-01 LAB — RPR

## 2017-04-01 LAB — 2HR GTT W 1 HR, CARPENTER, 75 G
GLUCOSE, 1 HR, GEST: 153 mg/dL (ref <180)
GLUCOSE, 2 HR, GEST: 100 mg/dL (ref <153)
Glucose, Fasting, Gest: 81 mg/dL (ref 65–91)

## 2017-04-01 LAB — HIV ANTIBODY (ROUTINE TESTING W REFLEX): HIV 1&2 Ab, 4th Generation: NONREACTIVE

## 2017-04-02 ENCOUNTER — Telehealth: Payer: Self-pay

## 2017-04-02 NOTE — Telephone Encounter (Signed)
Spoke with pt and she is aware that she passed her 2 hour GTT 

## 2017-04-05 ENCOUNTER — Ambulatory Visit (HOSPITAL_COMMUNITY)
Admission: RE | Admit: 2017-04-05 | Discharge: 2017-04-05 | Disposition: A | Discharge status: Home / Self Care | Payer: 59 | Source: Ambulatory Visit | Attending: Obstetrics & Gynecology | Admitting: Obstetrics & Gynecology

## 2017-04-05 ENCOUNTER — Other Ambulatory Visit: Payer: Self-pay | Admitting: Obstetrics & Gynecology

## 2017-04-05 DIAGNOSIS — E669 Obesity, unspecified: Secondary | ICD-10-CM | POA: Diagnosis not present

## 2017-04-05 DIAGNOSIS — O99213 Obesity complicating pregnancy, third trimester: Secondary | ICD-10-CM | POA: Insufficient documentation

## 2017-04-05 DIAGNOSIS — O09523 Supervision of elderly multigravida, third trimester: Secondary | ICD-10-CM | POA: Insufficient documentation

## 2017-04-05 DIAGNOSIS — O26843 Uterine size-date discrepancy, third trimester: Secondary | ICD-10-CM | POA: Insufficient documentation

## 2017-04-05 DIAGNOSIS — Z6839 Body mass index (BMI) 39.0-39.9, adult: Secondary | ICD-10-CM | POA: Insufficient documentation

## 2017-04-05 DIAGNOSIS — Z3A29 29 weeks gestation of pregnancy: Secondary | ICD-10-CM | POA: Diagnosis not present

## 2017-04-14 ENCOUNTER — Ambulatory Visit (INDEPENDENT_AMBULATORY_CARE_PROVIDER_SITE_OTHER): Payer: 59 | Admitting: Obstetrics & Gynecology

## 2017-04-14 VITALS — BP 107/62 | HR 82 | Wt 281.0 lb

## 2017-04-14 DIAGNOSIS — Z3483 Encounter for supervision of other normal pregnancy, third trimester: Secondary | ICD-10-CM

## 2017-04-14 NOTE — Patient Instructions (Signed)
Return to clinic for any scheduled appointments or obstetric concerns, or go to [facility] for evaluation  

## 2017-04-14 NOTE — Progress Notes (Signed)
PRENATAL VISIT NOTE  Subjective:  Destiny Joseph is a 36 y.o. [id number] at 8258w4d being seen today for ongoing prenatal care.  She is currently monitored for the following issues for this low-risk pregnancy and has Supervision of normal pregnancy, antepartum; AMA; History of abnormal cervical Pap smear; Obesity in pregnancy; Rubella non-immune status, antepartum; Pap smear of cervix with ASCUS, cannot exclude HGSIL; High risk HPV infection; and Group B streptococcus urinary tract infection affecting pregnancy on her problem list.  Patient reports no complaints.  Contractions: Irritability. Vag. Bleeding: None, Scant.  Movement: Present. Denies leaking of fluid.   The following portions of the patient's history were reviewed and updated as appropriate: allergies, current medications, past family history, past medical history, past social history, past surgical history and problem list. Problem list updated.  Objective:   Vitals:   04/14/17 1047  BP: 107/62  Pulse: 82  Weight: 281 lb (127.5 kg)    Fetal Status: Fetal Heart Rate (bpm): 147 Fundal Height: 34 cm Movement: Present     General:  Alert, oriented and cooperative. Patient is in no acute distress.  Skin: Skin is warm and dry. No rash noted.   Cardiovascular: Normal heart rate noted  Respiratory: Normal respiratory effort, no problems with respiration noted  Abdomen: Soft, gravid, appropriate for gestational age. Pain/Pressure: Absent     Pelvic:  Cervical exam deferred        Extremities: Normal range of motion.  Edema: None  Mental Status: Normal mood and affect. Normal behavior. Normal judgment and thought content.   [place] Mfm Ob Follow Up  Result Date: 04/06/2017 ----------------------------------------------------------------------  OBSTETRICS REPORT                      (Signed Final 04/06/2017 07:59 am) ---------------------------------------------------------------------- Patient Info  ID #:       [id number]                           D.O.B.:  23-Jan-1981 (35 yrs)  Name:       Destiny Joseph             Visit Date: 04/05/2017 01:31 pm ---------------------------------------------------------------------- Performed By  Performed By:     Vivien Rotaachael H Small        Ref. Address:     [address]                    [address]                                                             [address]                                                             [place], [place]  [address]  Attending:        Charlsie Merles MD         Location:         [facility]  Referred By:      Allie Bossier MD ---------------------------------------------------------------------- Orders   #  Description                                 Code   1  [place] MFM OB FOLLOW UP                         [phone]  ----------------------------------------------------------------------   #  Ordered By               Order #        Accession #    Episode #   1  Elsie Lincoln            [id number]      [id number]     [id number]  ---------------------------------------------------------------------- Indications   [redacted] weeks gestation of pregnancy                Z3A.29   Obesity complicating pregnancy, second         O99.212   trimester   Encounter for other antenatal screening        Z36.2   follow-up   Advanced maternal age multigravida 64+,        O50.522   second trimester   Uterine size-date discrepancy, third trimester O26.843  ---------------------------------------------------------------------- OB History  Blood Type:            Height:  5'9"   Weight (lb):  269       BMI:  39.72  Gravidity:    4         Term:   2        Prem:   0        SAB:   1  TOP:          0       Ectopic:  0        Living: 2 ---------------------------------------------------------------------- Fetal Evaluation  Num Of Fetuses:     1  Fetal Heart         154  Rate(bpm):  Cardiac Activity:   Observed  Presentation:       Transverse, head to maternal left   Placenta:           Anterior, above cervical os  P. Cord Insertion:  Previously Visualized  Amniotic Fluid  AFI FV:      Subjectively upper-normal  AFI Sum(cm)     %Tile       Largest Pocket(cm)  24.49           96          9.08  RUQ(cm)       RLQ(cm)       LUQ(cm)        LLQ(cm)  5.89          7.94          9.08           1.58 ---------------------------------------------------------------------- Biometry  BPD:      76.2  mm     G. Age:  30w 4d         77  %    CI:  74.41   %    70 - 86                                                          FL/HC:      19.9   %    19.6 - 20.8  HC:      280.4  mm     G. Age:  30w 5d         59  %    HC/AC:      1.01        0.99 - 1.21  AC:      277.2  mm     G. Age:  31w 6d         96  %    FL/BPD:     73.1   %    71 - 87  FL:       55.7  mm     G. Age:  29w 2d         37  %    FL/AC:      20.1   %    20 - 24  HUM:      51.6  mm     G. Age:  30w 1d         64  %  Est. FW:    1646  gm    3 lb 10 oz      78  % ---------------------------------------------------------------------- Gestational Age  LMP:           29w 2d        Date:  09/12/16                 EDD:   06/19/17  U/S Today:     30w 4d                                        EDD:   06/10/17  Best:          29w 2d     Det. By:  LMP  (09/12/16)          EDD:   06/19/17 ---------------------------------------------------------------------- Anatomy  Cranium:               Appears normal         Aortic Arch:            Previously seen  Cavum:                 Previously seen        Ductal Arch:            Previously seen  Ventricles:            Appears normal         Diaphragm:              Previously seen  Choroid Plexus:        Previously seen        Stomach:                Appears normal, left  sided  Cerebellum:            Previously seen        Abdomen:                Appears normal  Posterior Fossa:       Previously seen        Abdominal Wall:          Previously seen  Nuchal Fold:           Previously seen        Cord Vessels:           Previously seen  Face:                  Orbits and profile     Kidneys:                Appear normal                         previously seen  Lips:                  Previously seen        Bladder:                Appears normal  Thoracic:              Appears normal         Spine:                  Previously seen  Heart:                 Appears normal         Upper Extremities:      Previously seen                         (4CH, axis, and situs  RVOT:                  Previously seen        Lower Extremities:      Previously seen  LVOT:                  Appears normal  Other:  Fetus appears to be a female. Heels and 5th digit previously seen.          Nasal bone prev visualized. Technically difficult due to maternal          habitus and fetal position. ---------------------------------------------------------------------- Cervix Uterus Adnexa  Cervix  Not visualized (advanced GA >29wks)  Uterus  No abnormality visualized.  Left Ovary  Not visualized.  Right Ovary  Not visualized.  Adnexa:       No abnormality visualized. No adnexal mass                visualized. ---------------------------------------------------------------------- Impression  Single IUP at 29w 2d. Size/dates discrepancy noted  Normal interval anatomy  Fetal growth is appropriate in the 78th percentile  Anterior placenta without previa  Amniotic fluid volume is borderline elevated with an AFI of  24.5cm ---------------------------------------------------------------------- Recommendations  Recommend repeat scan in 2-3 weeks to assess AFI ----------------------------------------------------------------------                 Charlsie Merles, MD Electronically Signed Final Report   04/06/2017 07:59 am ----------------------------------------------------------------------   Assessment and Plan:  Pregnancy: Z6X0960 at [redacted]w[redacted]d  1. Encounter for supervision of other  normal pregnancy in third trimester Will do repeat AFI here in office at next visit given AFO 24.5 cm on 04/06/17. Preterm labor symptoms and general obstetric precautions including but not limited to vaginal bleeding, contractions, leaking of fluid and fetal movement were reviewed in detail with the patient. Please refer to After Visit Summary for other counseling recommendations.  Return in about 2 weeks (around 04/28/2017) for OB Visit, AFI.   Jaynie Collins, MD

## 2017-04-28 ENCOUNTER — Ambulatory Visit (INDEPENDENT_AMBULATORY_CARE_PROVIDER_SITE_OTHER): Payer: 59 | Admitting: Obstetrics & Gynecology

## 2017-04-28 VITALS — BP 90/60 | HR 88 | Wt 281.0 lb

## 2017-04-28 DIAGNOSIS — B951 Streptococcus, group B, as the cause of diseases classified elsewhere: Secondary | ICD-10-CM

## 2017-04-28 DIAGNOSIS — Z3483 Encounter for supervision of other normal pregnancy, third trimester: Secondary | ICD-10-CM

## 2017-04-28 DIAGNOSIS — O99213 Obesity complicating pregnancy, third trimester: Secondary | ICD-10-CM

## 2017-04-28 DIAGNOSIS — O09523 Supervision of elderly multigravida, third trimester: Secondary | ICD-10-CM

## 2017-04-28 DIAGNOSIS — O2343 Unspecified infection of urinary tract in pregnancy, third trimester: Secondary | ICD-10-CM

## 2017-04-28 DIAGNOSIS — O99210 Obesity complicating pregnancy, unspecified trimester: Secondary | ICD-10-CM

## 2017-04-28 DIAGNOSIS — Z3480 Encounter for supervision of other normal pregnancy, unspecified trimester: Secondary | ICD-10-CM

## 2017-04-28 DIAGNOSIS — O2342 Unspecified infection of urinary tract in pregnancy, second trimester: Secondary | ICD-10-CM

## 2017-04-28 NOTE — Progress Notes (Signed)
   PRENATAL VISIT NOTE  Subjective:  Destiny Joseph is a 36 y.o. Z6X0960G4P2012 at 8140w4d being seen today for ongoing prenatal care.  She is currently monitored for the following issues for this high-risk pregnancy and has Supervision of normal pregnancy, antepartum; AMA; History of abnormal cervical Pap smear; Obesity in pregnancy; Rubella non-immune status, antepartum; Pap smear of cervix with ASCUS, cannot exclude HGSIL; High risk HPV infection; and Group B streptococcus urinary tract infection affecting pregnancy on her problem list.  Patient reports no complaints.  Contractions: Irritability. Vag. Bleeding: None.  Movement: Present. Denies leaking of fluid.   The following portions of the patient's history were reviewed and updated as appropriate: allergies, current medications, past family history, past medical history, past social history, past surgical history and problem list. Problem list updated.  Objective:   Vitals:   04/28/17 0925  BP: 90/60  Pulse: 88  Weight: 281 lb (127.5 kg)    Fetal Status: Fetal Heart Rate (bpm): 153   Movement: Present     General:  Alert, oriented and cooperative. Patient is in no acute distress.  Skin: Skin is warm and dry. No rash noted.   Cardiovascular: Normal heart rate noted  Respiratory: Normal respiratory effort, no problems with respiration noted  Abdomen: Soft, gravid, appropriate for gestational age.  Pain/Pressure: Absent     Pelvic: Cervical exam performed        Extremities: Normal range of motion.  Edema: None  Mental Status:  Normal mood and affect. Normal behavior. Normal judgment and thought content.   Beside u/s shows normal AFI  Assessment and Plan:  Pregnancy: A5W0981G4P2012 at 7240w4d  1. Supervision of other normal pregnancy, antepartum - schedule follow up u/s in the first part of August  2. Obesity in pregnancy - encouraged no more weight gain, discussed risks including but not limited to stillbirth  3. Group B  Streptococcus urinary tract infection affecting pregnancy in second trimester - treat in labor     Preterm labor symptoms and general obstetric precautions including but not limited to vaginal bleeding, contractions, leaking of fluid and fetal movement were reviewed in detail with the patient. Please refer to After Visit Summary for other counseling recommendations.  No Follow-up on file.   Allie BossierMyra C Dionis Autry, MD

## 2017-04-29 ENCOUNTER — Other Ambulatory Visit: Payer: Self-pay | Admitting: Family

## 2017-04-29 DIAGNOSIS — R000 Tachycardia, unspecified: Secondary | ICD-10-CM

## 2017-05-04 ENCOUNTER — Encounter: Payer: Self-pay | Admitting: *Deleted

## 2017-05-04 ENCOUNTER — Ambulatory Visit (INDEPENDENT_AMBULATORY_CARE_PROVIDER_SITE_OTHER): Payer: 59 | Admitting: *Deleted

## 2017-05-04 VITALS — BP 110/52 | HR 82 | Wt 283.0 lb

## 2017-05-04 DIAGNOSIS — O99213 Obesity complicating pregnancy, third trimester: Secondary | ICD-10-CM | POA: Diagnosis not present

## 2017-05-04 DIAGNOSIS — E669 Obesity, unspecified: Secondary | ICD-10-CM | POA: Diagnosis not present

## 2017-05-04 DIAGNOSIS — Z3480 Encounter for supervision of other normal pregnancy, unspecified trimester: Secondary | ICD-10-CM

## 2017-05-04 DIAGNOSIS — O99210 Obesity complicating pregnancy, unspecified trimester: Secondary | ICD-10-CM

## 2017-05-05 ENCOUNTER — Other Ambulatory Visit: Payer: Self-pay | Admitting: Obstetrics & Gynecology

## 2017-05-05 ENCOUNTER — Ambulatory Visit (HOSPITAL_COMMUNITY)
Admission: RE | Admit: 2017-05-05 | Discharge: 2017-05-05 | Disposition: A | Discharge status: Home / Self Care | Payer: 59 | Source: Ambulatory Visit | Attending: Obstetrics & Gynecology | Admitting: Obstetrics & Gynecology

## 2017-05-05 DIAGNOSIS — Z3A33 33 weeks gestation of pregnancy: Secondary | ICD-10-CM | POA: Diagnosis not present

## 2017-05-05 DIAGNOSIS — O99210 Obesity complicating pregnancy, unspecified trimester: Secondary | ICD-10-CM

## 2017-05-05 DIAGNOSIS — O09523 Supervision of elderly multigravida, third trimester: Secondary | ICD-10-CM

## 2017-05-05 DIAGNOSIS — Z0371 Encounter for suspected problem with amniotic cavity and membrane ruled out: Secondary | ICD-10-CM

## 2017-05-05 DIAGNOSIS — E669 Obesity, unspecified: Secondary | ICD-10-CM | POA: Diagnosis not present

## 2017-05-05 DIAGNOSIS — O26843 Uterine size-date discrepancy, third trimester: Secondary | ICD-10-CM

## 2017-05-05 DIAGNOSIS — Z6839 Body mass index (BMI) 39.0-39.9, adult: Secondary | ICD-10-CM | POA: Insufficient documentation

## 2017-05-05 DIAGNOSIS — O99213 Obesity complicating pregnancy, third trimester: Secondary | ICD-10-CM | POA: Insufficient documentation

## 2017-05-06 ENCOUNTER — Encounter: Payer: Self-pay | Admitting: Cardiology

## 2017-05-06 ENCOUNTER — Ambulatory Visit (INDEPENDENT_AMBULATORY_CARE_PROVIDER_SITE_OTHER): Payer: 59 | Admitting: Cardiology

## 2017-05-06 ENCOUNTER — Other Ambulatory Visit: Payer: 59

## 2017-05-06 VITALS — BP 110/54 | HR 103 | Ht 69.0 in | Wt 282.1 lb

## 2017-05-06 DIAGNOSIS — R002 Palpitations: Secondary | ICD-10-CM

## 2017-05-06 DIAGNOSIS — R011 Cardiac murmur, unspecified: Secondary | ICD-10-CM | POA: Diagnosis not present

## 2017-05-06 NOTE — Progress Notes (Signed)
Cardiology Office Note:    Date:  05/06/2017   ID:  Destiny Joseph, DOB 02/07/1981, MRN [id number]  PCP:  Darrin Nipperollege, [facility] [place]  Cardiologist:  Garwin Brothersajan R Ibtisam Benge, MD   Referring MD: Marlis EdelsonKarim, Walidah N, CNM    ASSESSMENT:    1. Palpitations   2. Murmur, cardiac    PLAN:    In order of problems listed above:  I reassured the patient about my findings today. In view of her symptoms we will do a TSH blood test.   We will perform a48-hour Holter monitor to directly assess her symptoms. echocardiogram will be done to assess murmur heard on auscultation. Patient was advised about appropriate posture. I told her to lie on her side especially at this time of pregnancy her uterine pressure on inferior vena cava may be causing issues which may give rise to reflex tachycardia. She vocalized understanding. I told her to be well hydrated and to consider using compression stockings. I'm not keen on initiating her on any medications such as beta blockers at this time because the patient's blood pressure is borderline and I'm afraid it might make her hypotensive. Also she's not keen on taking any medications during pregnancy herself. She will be seen in follow-up appointment on a when necessary basis based on the findings of the aforementioned tests. She knows to go to the nearest emergency room for any concerning symptoms.   Medication Adjustments/Labs and Tests Ordered: Current medicines are reviewed at length with the patient today.  Concerns regarding medicines are outlined above.  Orders Placed This Encounter  Procedures  . TSH  . Holter monitor - 48 hour  . EKG 12-Lead  . ECHOCARDIOGRAM COMPLETE   No orders of the defined types were placed in this encounter.    History of Present Illness:    Destiny Joseph is a 36 y.o. female who is being seen today for the evaluation of Palpitations at the request of Marlis EdelsonKarim, Walidah N, CNM. Patient is a pleasant 36 year old female.  She mentions to me that she is in the 33rd week of pregnancy. She notices that her heart rate to be elevated on her Smart watch. He is the reason she is here for evaluation. No chest pain orthopnea or PND. No history of syncope. She is concerned about this and is here for evaluation. At the time of my evaluation she is alert awake oriented and in no distress.  Past Medical History:  Diagnosis Date  . Anxiety   . Vaginal Pap smear, abnormal    Required cryotherapy    Past Surgical History:  Procedure Laterality Date  . DILATION AND CURETTAGE OF UTERUS    . foot cyst removal Left     Current Medications: Current Meds  Medication Sig  . fluticasone (FLONASE) 50 MCG/ACT nasal spray Place into both nostrils daily.  Marland Kitchen. loratadine (CLARITIN) 10 MG tablet Take 1 tablet (10 mg total) by mouth daily.  . Oxymetazoline HCl (AFRIN SINUS NA) Place 1-2 sprays into the nose daily.     Allergies:   Patient has no known allergies.   Social History   Social History  . Marital status: Single    Spouse name: N/A  . Number of children: N/A  . Years of education: N/A   Occupational History  . photographer    Social History Main Topics  . Smoking status: Never Smoker  . Smokeless tobacco: Never Used  . Alcohol use No  . Drug use: No  .  Sexual activity: Yes    Birth control/ protection: None   Other Topics Concern  . None   Social History Narrative  . None     Family History: The patient's family history includes Bipolar disorder in her sister; Breast cancer (age of onset: 2540) in her maternal aunt; Breast cancer (age of onset: 7557) in her mother; Diabetes in her father; Hypertension in her father; Ovarian cysts in her mother.  ROS:   Please see the history of present illness.    All other systems reviewed and are negative.  EKGs/Labs/Other Studies Reviewed:    The following studies were reviewed today: I reviewed records and labs from previous evaluations. I reassured the patient  about my findings today. In view of her symptoms we will do a TSH blood test   Recent Labs: 12/15/2016: ALT 28; BUN 14; Creatinine, Ser 0.80; Potassium 4.3; Sodium 134 03/31/2017: Hemoglobin 11.0; Platelets 287  Recent Lipid Panel No results found for: CHOL, TRIG, HDL, CHOLHDL, VLDL, LDLCALC, LDLDIRECT  Physical Exam:    VS:  BP (!) 110/54   Pulse (!) 103   Ht 5\' 9"  (1.753 m)   Wt 282 lb 1.3 oz (128 kg)   LMP 09/12/2016   SpO2 99%   BMI 41.66 kg/m     Wt Readings from Last 3 Encounters:  05/06/17 282 lb 1.3 oz (128 kg)  05/04/17 283 lb (128.4 kg)  04/28/17 281 lb (127.5 kg)     GEN: Patient is in no acute distress HEENT: Normal NECK: No JVD; No carotid bruits LYMPHATICS: No lymphadenopathy CARDIAC: S1 S2 regular, 2/6 systolic murmur at the apex. RESPIRATORY:  Clear to auscultation without rales, wheezing or rhonchi  ABDOMEN: Soft, non-tender, non-distended MUSCULOSKELETAL:  No edema; No deformity  SKIN: Warm and dry NEUROLOGIC:  Alert and oriented x 3 PSYCHIATRIC:  Normal affect    Signed, Garwin Brothersajan R James Senn, MD  05/06/2017 3:38 PM    [facility]

## 2017-05-06 NOTE — Patient Instructions (Signed)
Medication Instructions:  Your physician recommends that you continue on your current medications as directed. Please refer to the Current Medication list given to you today.   Labwork: Your physician recommends that you return for lab work in: today. TSH  Testing/Procedures: Your physician has requested that you have an echocardiogram. Echocardiography is a painless test that uses sound waves to create images of your heart. It provides your doctor with information about the size and shape of your heart and how well your heart's chambers and valves are working. This procedure takes approximately one hour. There are no restrictions for this procedure.  Your physician has recommended that you wear a holter monitor. Holter monitors are medical devices that record the heart's electrical activity. Doctors most often use these monitors to diagnose arrhythmias. Arrhythmias are problems with the speed or rhythm of the heartbeat. The monitor is a small, portable device. You can wear one while you do your normal daily activities. This is usually used to diagnose what is causing palpitations/syncope (passing out).  You had an EKG today.  Follow-Up: Your physician recommends that you schedule a follow-up appointment as needed if symptoms worsen or fail to improve.   Any Other Special Instructions Will Be Listed Below (If Applicable).     If you need a refill on your cardiac medications before your next appointment, please call your pharmacy.

## 2017-05-07 ENCOUNTER — Ambulatory Visit: Payer: 59 | Admitting: Cardiology

## 2017-05-07 ENCOUNTER — Ambulatory Visit (INDEPENDENT_AMBULATORY_CARE_PROVIDER_SITE_OTHER): Payer: 59 | Admitting: *Deleted

## 2017-05-07 VITALS — Wt 282.0 lb

## 2017-05-07 DIAGNOSIS — E669 Obesity, unspecified: Secondary | ICD-10-CM | POA: Diagnosis not present

## 2017-05-07 DIAGNOSIS — O99213 Obesity complicating pregnancy, third trimester: Secondary | ICD-10-CM

## 2017-05-07 DIAGNOSIS — O99210 Obesity complicating pregnancy, unspecified trimester: Secondary | ICD-10-CM

## 2017-05-07 LAB — TSH: TSH: 1.26 u[IU]/mL (ref 0.450–4.500)

## 2017-05-11 ENCOUNTER — Encounter: Payer: Self-pay | Admitting: *Deleted

## 2017-05-11 ENCOUNTER — Ambulatory Visit (INDEPENDENT_AMBULATORY_CARE_PROVIDER_SITE_OTHER): Payer: 59 | Admitting: *Deleted

## 2017-05-11 VITALS — BP 107/55 | HR 92 | Wt 283.0 lb

## 2017-05-11 DIAGNOSIS — O09522 Supervision of elderly multigravida, second trimester: Secondary | ICD-10-CM

## 2017-05-11 NOTE — Addendum Note (Signed)
Addended by: Granville LewisLARK, Chee Kinslow L on: 05/11/2017 10:09 AM   Modules accepted: Orders

## 2017-05-12 ENCOUNTER — Ambulatory Visit (INDEPENDENT_AMBULATORY_CARE_PROVIDER_SITE_OTHER): Payer: 59 | Admitting: Obstetrics & Gynecology

## 2017-05-12 VITALS — BP 102/62 | HR 84 | Wt 283.0 lb

## 2017-05-12 DIAGNOSIS — Z3483 Encounter for supervision of other normal pregnancy, third trimester: Secondary | ICD-10-CM

## 2017-05-12 DIAGNOSIS — Z3480 Encounter for supervision of other normal pregnancy, unspecified trimester: Secondary | ICD-10-CM

## 2017-05-12 DIAGNOSIS — O3663X0 Maternal care for excessive fetal growth, third trimester, not applicable or unspecified: Secondary | ICD-10-CM

## 2017-05-12 NOTE — Progress Notes (Signed)
   PRENATAL VISIT NOTE  Subjective:  Destiny Joseph is a 36 y.o. [id number] at 733w4d being seen today for ongoing prenatal care.  She is currently monitored for the following issues for this high-risk pregnancy and has Supervision of normal pregnancy, antepartum; AMA; History of abnormal cervical Pap smear; Obesity in pregnancy; Rubella non-immune status, antepartum; Pap smear of cervix with ASCUS, cannot exclude HGSIL; High risk HPV infection; Group B streptococcus urinary tract infection affecting pregnancy; Palpitations; Murmur, cardiac; and Macrosomia affecting management of mother in third trimester on her problem list.  Patient reports continued palpatations (saw cards).  Contractions: Irritability. Vag. Bleeding: None.  Movement: Present. Denies leaking of fluid.   The following portions of the patient's history were reviewed and updated as appropriate: allergies, current medications, past family history, past medical history, past social history, past surgical history and problem list. Problem list updated.  Objective:   Vitals:   05/12/17 0929  BP: 102/62  Pulse: 84  Weight: 283 lb (128.4 kg)    Fetal Status: Fetal Heart Rate (bpm): 155   Movement: Present     General:  Alert, oriented and cooperative. Patient is in no acute distress.  Skin: Skin is warm and dry. No rash noted.   Cardiovascular: Normal heart rate noted  Respiratory: Normal respiratory effort, no problems with respiration noted  Abdomen: Soft, gravid, appropriate for gestational age.  Pain/Pressure: Absent     Pelvic: Cervical exam deferred        Extremities: Normal range of motion.  Edema: None  Mental Status:  Normal mood and affect. Normal behavior. Normal judgment and thought content.   Assessment and Plan:  Pregnancy: [id number] at 5933w4d  1. Macrosomia of fetus affecting management of mother in third trimester, single or unspecified fetus -GTT nml; 1 hr pp today is 109 - US MFM OB FOLLOW UP;  Future  2. Supervision of other normal pregnancy, antepartum - F/U with cardiologist--echo and holter  Preterm labor symptoms and general obstetric precautions including but not limited to vaginal bleeding, contractions, leaking of fluid and fetal movement were reviewed in detail with the patient. Please refer to After Visit Summary for other counseling recommendations.  Return in about 2 weeks (around 05/26/2017).   Elsie LincolnKelly Pegah Segel, MD

## 2017-05-14 ENCOUNTER — Ambulatory Visit: Payer: 59 | Admitting: Obstetrics and Gynecology

## 2017-05-14 VITALS — BP 103/62 | HR 86 | Wt 284.1 lb

## 2017-05-14 DIAGNOSIS — O403XX0 Polyhydramnios, third trimester, not applicable or unspecified: Secondary | ICD-10-CM

## 2017-05-14 NOTE — Progress Notes (Signed)
NST Read-Only Visit FHR: 135 bpm / moderate variability / accels present / decels absent TOCO: Occ UC  Keep scheduled appt next week, weekly NSTs to continue  Wells Fargoolitta Delio Slates, CNM 05/14/2017 11:59 AM

## 2017-05-18 ENCOUNTER — Ambulatory Visit (INDEPENDENT_AMBULATORY_CARE_PROVIDER_SITE_OTHER): Payer: 59

## 2017-05-18 VITALS — BP 98/58 | HR 87

## 2017-05-18 DIAGNOSIS — O403XX0 Polyhydramnios, third trimester, not applicable or unspecified: Secondary | ICD-10-CM

## 2017-05-20 ENCOUNTER — Other Ambulatory Visit: Payer: Self-pay

## 2017-05-20 ENCOUNTER — Ambulatory Visit (HOSPITAL_BASED_OUTPATIENT_CLINIC_OR_DEPARTMENT_OTHER): Payer: No Typology Code available for payment source

## 2017-05-20 ENCOUNTER — Ambulatory Visit: Payer: 59

## 2017-05-20 DIAGNOSIS — E669 Obesity, unspecified: Secondary | ICD-10-CM | POA: Diagnosis present

## 2017-05-20 DIAGNOSIS — I071 Rheumatic tricuspid insufficiency: Secondary | ICD-10-CM

## 2017-05-20 DIAGNOSIS — R002 Palpitations: Secondary | ICD-10-CM

## 2017-05-20 DIAGNOSIS — Z8249 Family history of ischemic heart disease and other diseases of the circulatory system: Secondary | ICD-10-CM | POA: Insufficient documentation

## 2017-05-20 DIAGNOSIS — O99213 Obesity complicating pregnancy, third trimester: Secondary | ICD-10-CM | POA: Diagnosis present

## 2017-05-20 DIAGNOSIS — O99820 Streptococcus B carrier state complicating pregnancy: Secondary | ICD-10-CM | POA: Diagnosis present

## 2017-05-20 DIAGNOSIS — O99413 Diseases of the circulatory system complicating pregnancy, third trimester: Secondary | ICD-10-CM | POA: Insufficient documentation

## 2017-05-20 DIAGNOSIS — Z3A35 35 weeks gestation of pregnancy: Secondary | ICD-10-CM | POA: Insufficient documentation

## 2017-05-20 DIAGNOSIS — O3663X0 Maternal care for excessive fetal growth, third trimester, not applicable or unspecified: Secondary | ICD-10-CM | POA: Diagnosis present

## 2017-05-20 DIAGNOSIS — O09523 Supervision of elderly multigravida, third trimester: Secondary | ICD-10-CM | POA: Diagnosis not present

## 2017-05-20 DIAGNOSIS — Z3A36 36 weeks gestation of pregnancy: Secondary | ICD-10-CM | POA: Diagnosis not present

## 2017-05-21 ENCOUNTER — Ambulatory Visit (INDEPENDENT_AMBULATORY_CARE_PROVIDER_SITE_OTHER): Payer: 59 | Admitting: Advanced Practice Midwife

## 2017-05-21 VITALS — BP 113/61 | HR 88 | Wt 285.0 lb

## 2017-05-21 DIAGNOSIS — O09523 Supervision of elderly multigravida, third trimester: Secondary | ICD-10-CM

## 2017-05-21 DIAGNOSIS — Z3483 Encounter for supervision of other normal pregnancy, third trimester: Secondary | ICD-10-CM

## 2017-05-21 DIAGNOSIS — O09529 Supervision of elderly multigravida, unspecified trimester: Secondary | ICD-10-CM

## 2017-05-21 DIAGNOSIS — R002 Palpitations: Secondary | ICD-10-CM

## 2017-05-21 DIAGNOSIS — Z3480 Encounter for supervision of other normal pregnancy, unspecified trimester: Secondary | ICD-10-CM

## 2017-05-21 DIAGNOSIS — Z0371 Encounter for suspected problem with amniotic cavity and membrane ruled out: Secondary | ICD-10-CM

## 2017-05-21 DIAGNOSIS — Z3A36 36 weeks gestation of pregnancy: Secondary | ICD-10-CM

## 2017-05-21 DIAGNOSIS — B951 Streptococcus, group B, as the cause of diseases classified elsewhere: Secondary | ICD-10-CM

## 2017-05-21 DIAGNOSIS — O2343 Unspecified infection of urinary tract in pregnancy, third trimester: Secondary | ICD-10-CM

## 2017-05-21 NOTE — Progress Notes (Signed)
   PRENATAL VISIT NOTE  Subjective:  Destiny Joseph is a 36 y.o. [id number] at [redacted]w[redacted]d being seen today for ongoing prenatal care.  She is currently monitored for the following issues for this high-risk pregnancy and has Supervision of normal pregnancy, antepartum; AMA; History of abnormal cervical Pap smear; Obesity in pregnancy; Rubella non-immune status, antepartum; Pap smear of cervix with ASCUS, cannot exclude HGSIL; High risk HPV infection; Group B streptococcus urinary tract infection affecting pregnancy; Palpitations; Murmur, cardiac; Macrosomia affecting management of mother in third trimester; and Suspected polyhydramnios not found on her problem list.  Patient reports occasional contractions.  Contractions: Irritability. Vag. Bleeding: None.  Movement: Present. Denies leaking of fluid.   The following portions of the patient's history were reviewed and updated as appropriate: allergies, current medications, past family history, past medical history, past social history, past surgical history and problem list. Problem list updated.  Was started in antenatal testing partially at request of patient due to concerns about cardiac issues and elevated AFI (which has since normalized). Pt verbalizes fear that whatever is causing her palpitations/rapid heartbeat will cause some sort of cardiac emergency during labor. Saw cardiology. Nml echo, TSH and EKG. Holter monitoring planned, but device is not available until tomorrow. Reports a few episodes of rapid heartbeat up to 180's per her fitness tracker. States HR of fitness tracker corresponded to HR at cardiologists office. Denies SOB, chest pain, near-syncope. Just feels fatigued when it occurs.   Objective:   Vitals:   05/21/17 1037  BP: 113/61  Pulse: 88  Weight: 285 lb (129.3 kg)    Fetal Status: Fetal Heart Rate (bpm): NST-R Fundal Height: 37 cm Movement: Present  Presentation: Vertex  General:  Alert, oriented and cooperative. Patient  is in no acute distress.  Skin: Skin is warm and dry. No rash noted.   Cardiovascular: Normal heart rate noted  Respiratory: Normal respiratory effort, no problems with respiration noted  Abdomen: Soft, gravid, appropriate for gestational age.  Pain/Pressure: Absent     Pelvic: Cervical exam deferred        Extremities: Normal range of motion.  Edema: None  Mental Status:  Normal mood and affect. Normal behavior. Normal judgment and thought content.   Assessment and Plan:  Pregnancy: [id number] at [redacted]w[redacted]d  1. Suspected polyhydramnios not found - Growth [place]  2. Palpitations - Nml Echo - Holter monitoring starting tomorrow  - Discussed that it is very reassuring that echo and AKG were nml which eliminates most possibilities for cardiac emergencies in labor.  - Reiterated cardiologists recommendations to lie on side if episode occurs. Stay well-hydrated.   3. Supervision of other normal pregnancy, antepartum   4. Group B Streptococcus urinary tract infection affecting pregnancy in third trimester -PCN labor  5. AMA  Term labor symptoms and general obstetric precautions including but not limited to vaginal bleeding, contractions, leaking of fluid and fetal movement were reviewed in detail with the patient. Please refer to After Visit Summary for other counseling recommendations.  F/U 1 week. Will continue antenatal testing.    Dorathy Kinsman, CNM

## 2017-05-23 ENCOUNTER — Observation Stay (HOSPITAL_COMMUNITY)
Admission: AD | Admit: 2017-05-23 | Discharge: 2017-05-24 | DRG: 778 | Disposition: A | Discharge status: Home / Self Care | Payer: No Typology Code available for payment source | Source: Ambulatory Visit | Attending: Obstetrics & Gynecology | Admitting: Obstetrics & Gynecology

## 2017-05-23 ENCOUNTER — Encounter (HOSPITAL_COMMUNITY): Payer: Self-pay

## 2017-05-23 DIAGNOSIS — B977 Papillomavirus as the cause of diseases classified elsewhere: Secondary | ICD-10-CM

## 2017-05-23 DIAGNOSIS — O99213 Obesity complicating pregnancy, third trimester: Secondary | ICD-10-CM | POA: Diagnosis present

## 2017-05-23 DIAGNOSIS — R011 Cardiac murmur, unspecified: Secondary | ICD-10-CM

## 2017-05-23 DIAGNOSIS — Z2839 Other underimmunization status: Secondary | ICD-10-CM

## 2017-05-23 DIAGNOSIS — O09523 Supervision of elderly multigravida, third trimester: Secondary | ICD-10-CM

## 2017-05-23 DIAGNOSIS — O99210 Obesity complicating pregnancy, unspecified trimester: Secondary | ICD-10-CM

## 2017-05-23 DIAGNOSIS — Z283 Underimmunization status: Secondary | ICD-10-CM

## 2017-05-23 DIAGNOSIS — O2343 Unspecified infection of urinary tract in pregnancy, third trimester: Secondary | ICD-10-CM

## 2017-05-23 DIAGNOSIS — O09529 Supervision of elderly multigravida, unspecified trimester: Secondary | ICD-10-CM

## 2017-05-23 DIAGNOSIS — O3663X0 Maternal care for excessive fetal growth, third trimester, not applicable or unspecified: Secondary | ICD-10-CM | POA: Diagnosis present

## 2017-05-23 DIAGNOSIS — B951 Streptococcus, group B, as the cause of diseases classified elsewhere: Secondary | ICD-10-CM

## 2017-05-23 DIAGNOSIS — E669 Obesity, unspecified: Secondary | ICD-10-CM | POA: Diagnosis present

## 2017-05-23 DIAGNOSIS — R002 Palpitations: Secondary | ICD-10-CM

## 2017-05-23 DIAGNOSIS — O9989 Other specified diseases and conditions complicating pregnancy, childbirth and the puerperium: Secondary | ICD-10-CM

## 2017-05-23 DIAGNOSIS — R87611 Atypical squamous cells cannot exclude high grade squamous intraepithelial lesion on cytologic smear of cervix (ASC-H): Secondary | ICD-10-CM

## 2017-05-23 DIAGNOSIS — Z0371 Encounter for suspected problem with amniotic cavity and membrane ruled out: Secondary | ICD-10-CM

## 2017-05-23 DIAGNOSIS — Z3A36 36 weeks gestation of pregnancy: Secondary | ICD-10-CM

## 2017-05-23 DIAGNOSIS — O99820 Streptococcus B carrier state complicating pregnancy: Secondary | ICD-10-CM | POA: Diagnosis present

## 2017-05-23 DIAGNOSIS — Z3480 Encounter for supervision of other normal pregnancy, unspecified trimester: Secondary | ICD-10-CM

## 2017-05-23 DIAGNOSIS — Z8742 Personal history of other diseases of the female genital tract: Secondary | ICD-10-CM

## 2017-05-23 LAB — CBC
HEMATOCRIT: 33.6 % — AB (ref 36.0–46.0)
HEMOGLOBIN: 10.8 g/dL — AB (ref 12.0–15.0)
MCH: 25.8 pg — ABNORMAL LOW (ref 26.0–34.0)
MCHC: 32.1 g/dL (ref 30.0–36.0)
MCV: 80.4 fL (ref 78.0–100.0)
Platelets: 268 10*3/uL (ref 150–400)
RBC: 4.18 MIL/uL (ref 3.87–5.11)
RDW: 14.7 % (ref 11.5–15.5)
WBC: 11.8 10*3/uL — AB (ref 4.0–10.5)

## 2017-05-23 LAB — TYPE AND SCREEN
ABO/RH(D): O POS
Antibody Screen: NEGATIVE

## 2017-05-23 LAB — ABO/RH: ABO/RH(D): O POS

## 2017-05-23 MED ORDER — OXYCODONE-ACETAMINOPHEN 5-325 MG PO TABS: 2.0000 | ORAL_TABLET | ORAL | Status: DC | PRN

## 2017-05-23 MED ORDER — FLEET ENEMA 7-19 GM/118ML RE ENEM: 1.0000 | ENEMA | RECTAL | Status: DC | PRN

## 2017-05-23 MED ORDER — OXYCODONE-ACETAMINOPHEN 5-325 MG PO TABS: 1.0000 | ORAL_TABLET | ORAL | Status: DC | PRN

## 2017-05-23 MED ORDER — OXYTOCIN BOLUS FROM INFUSION: 500.0000 mL | Freq: Once | INTRAVENOUS | Status: DC

## 2017-05-23 MED ORDER — FENTANYL CITRATE (PF) 100 MCG/2ML IJ SOLN: 100.0000 ug | INTRAMUSCULAR | Status: DC | PRN

## 2017-05-23 MED ORDER — LACTATED RINGERS IV SOLN
INTRAVENOUS | Status: DC
  Administered 2017-05-23: 18:00:00 via INTRAVENOUS

## 2017-05-23 MED ORDER — PENICILLIN G POT IN DEXTROSE 60000 UNIT/ML IV SOLN
3.0000 10*6.[IU] | INTRAVENOUS | Status: DC
  Administered 2017-05-23: 3 10*6.[IU] via INTRAVENOUS
  Filled 2017-05-23 (×2): qty 50

## 2017-05-23 MED ORDER — BETAMETHASONE SOD PHOS & ACET 6 (3-3) MG/ML IJ SUSP
12.0000 mg | INTRAMUSCULAR | Status: DC
  Administered 2017-05-23: 12 mg via INTRAMUSCULAR
  Filled 2017-05-23: qty 2

## 2017-05-23 MED ORDER — OXYTOCIN 40 UNITS IN LACTATED RINGERS INFUSION - SIMPLE MED: 2.5000 [IU]/h | INTRAVENOUS | Status: DC

## 2017-05-23 MED ORDER — LACTATED RINGERS IV SOLN: 500.0000 mL | INTRAVENOUS | Status: DC | PRN

## 2017-05-23 MED ORDER — PENICILLIN G POTASSIUM 5000000 UNITS IJ SOLR
5.0000 10*6.[IU] | Freq: Once | INTRAVENOUS | Status: AC
  Administered 2017-05-23: 5 10*6.[IU] via INTRAVENOUS
  Filled 2017-05-23: qty 5

## 2017-05-23 MED ORDER — LIDOCAINE HCL (PF) 1 % IJ SOLN: 30.0000 mL | INTRAMUSCULAR | Status: DC | PRN

## 2017-05-23 MED ORDER — ACETAMINOPHEN 325 MG PO TABS: 650.0000 mg | ORAL_TABLET | ORAL | Status: DC | PRN

## 2017-05-23 MED ORDER — ONDANSETRON HCL 4 MG/2ML IJ SOLN: 4.0000 mg | Freq: Four times a day (QID) | INTRAMUSCULAR | Status: DC | PRN

## 2017-05-23 MED ORDER — SOD CITRATE-CITRIC ACID 500-334 MG/5ML PO SOLN: 30.0000 mL | ORAL | Status: DC | PRN

## 2017-05-23 NOTE — H&P (Signed)
LABOR AND DELIVERY ADMISSION HISTORY AND PHYSICAL NOTE  Destiny Joseph is a 36 y.o. female [phone] with IUP at [redacted]w[redacted]d by LMP c/w 1st trimester U?S presenting for PTL. Pregnancy complicated by AMA, obesity,  GBS bacteruria, RNI, fetal macrosomia, and borderline polyhydramnios which resolved.    She reports positive fetal movement. She denies leakage of fluid or vaginal bleeding.  Prenatal History/Complications: Clinic  [facility] Prenatal Labs  Dating LMP, consistent with 1st trimester [place] Blood type: O/POS/-- (02/12 1104)   Genetic Screen Quad negative Antibody:NEG (02/12 1104)  Anatomic [place] Nml female, but incomplete, [x]  f/u Nml Rubella: <0.90 (02/12 1104)  GTT Early:   5.0 A1C            Third trimester: 81,153,100 RPR: NON REAC (02/12 1104)   Flu vaccine  Desires HBsAg: NEGATIVE (02/12 1104)   TDaP vaccine 03/31/17                               HIV: NONREACTIVE (02/12 1104)   Baby Food   Bottle                                             GBS: Positive  Contraception  BTS Pap:  ASCUS, +HRHPV > recommend coloposcopy postpartum  Circumcision female Colpo 4/18:  Pediatrician  [facility]   Support Person  Molly Maduro FOB    Past Medical History: Past Medical History:  Diagnosis Date  . Anxiety   . Vaginal Pap smear, abnormal    Required cryotherapy    Past Surgical History: Past Surgical History:  Procedure Laterality Date  . DILATION AND CURETTAGE OF UTERUS    . foot cyst removal Left     Obstetrical History: OB History    Gravida Para Term Preterm AB Living   4 2 2   1 2    SAB TAB Ectopic Multiple Live Births   1       2      Social History: Social History   Social History  . Marital status: Single    Spouse name: N/A  . Number of children: N/A  . Years of education: N/A   Occupational History  . photographer    Social History Main Topics  . Smoking status: Never Smoker  . Smokeless tobacco: Never Used  . Alcohol use No  . Drug use: No  . Sexual  activity: Yes    Birth control/ protection: None   Other Topics Concern  . None   Social History Narrative  . None    Family History: Family History  Problem Relation Age of Onset  . Breast cancer Mother 56  . Ovarian cysts Mother   . Hypertension Father   . Diabetes Father   . Bipolar disorder Sister   . Breast cancer Maternal Aunt 40    Allergies: No Known Allergies  Prescriptions Prior to Admission  Medication Sig Dispense Refill Last Dose  . calcium carbonate (TUMS - DOSED IN MG ELEMENTAL CALCIUM) 500 MG chewable tablet Chew 2 tablets by mouth 3 (three) times daily as needed for indigestion or heartburn.   05/22/2017 at Unknown time  . fluticasone (FLONASE) 50 MCG/ACT nasal spray Place 1 spray into both nostrils 2 (two) times daily.    05/22/2017 at Unknown time  . loratadine (CLARITIN) 10 MG tablet Take  1 tablet (10 mg total) by mouth daily. 30 tablet 2 05/22/2017 at Unknown time  . Oxymetazoline HCl (AFRIN SINUS NA) Place 1-2 sprays into the nose daily.   05/23/2017 at Unknown time     Review of Systems  All systems reviewed and negative except as stated in HPI  Physical Exam Blood pressure 126/76, pulse (!) 101, temperature 98.6 F (37 C), temperature source Oral, resp. rate 18, height 5\' 9"  (1.753 m), weight 284 lb (128.8 kg), last menstrual period 09/12/2016. General appearance: alert and cooperative Lungs: normal WOB. No respiratory distress Heart: mildly elevated heart rate Abdomen: soft, non-tender Extremities: No calf swelling or tenderness Presentation: cephalic Fetal monitoring: baseline rate 145, moderate variability, +acel, no devel Uterine activity: ctx q2-4 min Dilation: 2 Effacement (%): 50 Station: Ballotable Exam by:: Polos RN   Prenatal labs: ABO, Rh: --/--/O POS (08/19 1745) Antibody: PENDING (08/19 1745) Rubella: !Error! RPR: NON REAC (06/27 9604)  HBsAg: NEGATIVE (02/12 1104)  HIV: NONREACTIVE (06/27 0823)  GBS:   positive 2 hr GTT:  81, 153, 100 Genetic screening:  Quad negative Anatomy US: normal female   Prenatal Transfer Tool  Maternal Diabetes: No Genetic Screening: Normal Maternal Ultrasounds/Referrals: Normal Fetal Ultrasounds or other Referrals:  None Maternal Substance Abuse:  No Significant Maternal Medications:  None Significant Maternal Lab Results: Lab values include: Group B Strep positive  Results for orders placed or performed during the hospital encounter of 05/23/17 (from the past 24 hour(s))  CBC   Collection Time: 05/23/17  5:45 PM  Result Value Ref Range   WBC 11.8 (H) 4.0 - 10.5 K/uL   RBC 4.18 3.87 - 5.11 MIL/uL   Hemoglobin 10.8 (L) 12.0 - 15.0 g/dL   HCT 54.0 (L) 98.1 - 19.1 %   MCV 80.4 78.0 - 100.0 fL   MCH 25.8 (L) 26.0 - 34.0 pg   MCHC 32.1 30.0 - 36.0 g/dL   RDW 47.8 29.5 - 62.1 %   Platelets 268 150 - 400 K/uL  Type and screen [facility]   Collection Time: 05/23/17  5:45 PM  Result Value Ref Range   ABO/RH(D) O POS    Antibody Screen PENDING    Sample Expiration 05/26/2017     Patient Active Problem List   Diagnosis Date Noted  . Preterm labor 05/23/2017  . Suspected polyhydramnios not found 05/21/2017  . Macrosomia affecting management of mother in third trimester 05/12/2017  . Palpitations 05/06/2017  . Murmur, cardiac 05/06/2017  . Group B streptococcus urinary tract infection affecting pregnancy 02/21/2017  . Pap smear of cervix with ASCUS, cannot exclude HGSIL 01/11/2017  . 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 Rubella non-immune status, antepartum 11/20/2016  . Obesity in pregnancy 11/19/2016  . Supervision of normal pregnancy, antepartum 11/16/2016  . AMA 11/16/2016  . History of abnormal cervical Pap smear 11/16/2016    Assessment: Destiny Joseph is a 36 y.o. [id number] at [redacted]w[redacted]d here for PTL.  #Labor: Expectant management. BMZ given at 1858 #Pain: Per patient's request #FWB: Cat I #ID:  GBS +, will start PCN #MOF: breast #MOC:  BTL (papers signed 05/04/17) #Circ:  N/a; girl  Destiny Joseph 05/23/2017, 6:30 PM

## 2017-05-23 NOTE — MAU Note (Signed)
Urine in lab 

## 2017-05-23 NOTE — Anesthesia Pain Management Evaluation Note (Signed)
  CRNA Pain Management Visit Note  Patient: Destiny Joseph, 36 y.o., female  "Hello I am a member of the anesthesia team at [facility]. We have an anesthesia team available at all times to provide care throughout the hospital, including epidural management and anesthesia for C-section. I don't know your plan for the delivery whether it a natural birth, water birth, IV sedation, nitrous supplementation, doula or epidural, but we want to meet your pain goals."   1.Was your pain managed to your expectations on prior hospitalizations?   Yes   2.What is your expectation for pain management during this hospitalization?     Epidural  3.How can we help you reach that goal? epidural  Record the patient's initial score and the patient's pain goal.   Pain: 0  Pain Goal: unsure [facility] wants you to be able to say your pain was always managed very well.  Destiny Joseph 05/23/2017

## 2017-05-23 NOTE — MAU Note (Signed)
Patient presents with onset of contractions since 10 this morning, now 3 to 5 minutes apart.

## 2017-05-24 LAB — RPR: RPR: NONREACTIVE

## 2017-05-24 NOTE — Discharge Summary (Signed)
Physician Discharge Summary  Patient ID: Destiny Joseph MRN: [id number] DOB/AGE: 1981/01/29 35 y.o.  Admit date: 05/23/2017 Discharge date: 05/24/2017  Admission Diagnoses: 1) IUP@36 .1wks 2) Preterm cervical change  Discharge Diagnoses:  Principal Problem:   Preterm labor   Discharged Condition: good  Hospital Course: Destiny Joseph is a 35yo [id number] @ 36.1wks who was admitted to [facility] following cervical change noted in MAU from 1/thick to 2/50%. Her preg has been followed by the [facility] office and has been remarkable for 1) AMA 2) obesity 3) GBS bacteruria 4) RNI 5) fetal macrosomia 6) resolved borderline polyhydramnios. She remained under expectant management overnight with a cx exam in the morning of 05/24/17 showing no further change. She was deemed to have received the full benefit of her hospital stay and was discharged home with preterm labor precautions.  Consults: None  Significant Diagnostic Studies: labs:  CBC    Component Value Date/Time   WBC 11.8 (H) 05/23/2017 1745   RBC 4.18 05/23/2017 1745   HGB 10.8 (L) 05/23/2017 1745   HCT 33.6 (L) 05/23/2017 1745   PLT 268 05/23/2017 1745   MCV 80.4 05/23/2017 1745   MCH 25.8 (L) 05/23/2017 1745   MCHC 32.1 05/23/2017 1745   RDW 14.7 05/23/2017 1745   LYMPHSABS 1,806 11/16/2016 1104   MONOABS 430 11/16/2016 1104   EOSABS 258 11/16/2016 1104   BASOSABS 0 11/16/2016 1104    Treatments: IV hydration, antibiotics: PCN and steroids: betamethasone x 1  Discharge Exam: Blood pressure (!) 126/57, pulse 78, temperature 98.2 F (36.8 C), temperature source Oral, resp. rate 18, height 5\' 9"  (1.753 m), weight 128.8 kg (284 lb), last menstrual period 09/12/2016. General appearance: alert, cooperative and no distress Abd: gravid, soft  FHR 120s, +accels, no decels, Cat 1; toco: ctx mild, q 2-7 mins  Disposition: 01-Home or Self Care  Discharge Instructions    Discharge patient    Complete by:  As directed    Discharge disposition:  01-Home or Self Care   Discharge patient date:  05/24/2017     Allergies as of 05/24/2017   No Known Allergies     Medication List    TAKE these medications   AFRIN SINUS NA Place 1-2 sprays into the nose daily.   calcium carbonate 500 MG chewable tablet Commonly known as:  TUMS - dosed in mg elemental calcium Chew 2 tablets by mouth 3 (three) times daily as needed for indigestion or heartburn.   fluticasone 50 MCG/ACT nasal spray Commonly known as:  FLONASE Place 1 spray into both nostrils 2 (two) times daily.   loratadine 10 MG tablet Commonly known as:  CLARITIN Take 1 tablet (10 mg total) by mouth daily.      Follow-up Information    [facility] [place] Follow up.   Specialty:  Obstetrics and Gynecology Why:  Keep next scheduled appointments Contact information: [address], [address] [place] [address] [phone]          Signed: Cam Hai 05/24/2017, 6:59 AM

## 2017-05-24 NOTE — Discharge Instructions (Signed)
°Fetal Movement Counts °Patient Name: ________________________________________________ Patient Due Date: ____________________ °What is a fetal movement count? °A fetal movement count is the number of times that you feel your baby move during a certain amount of time. This may also be called a fetal kick count. A fetal movement count is recommended for every pregnant woman. You may be asked to start counting fetal movements as early as week 28 of your pregnancy. °Pay attention to when your baby is most active. You may notice your baby's sleep and wake cycles. You may also notice things that make your baby move more. You should do a fetal movement count: °· When your baby is normally most active. °· At the same time each day. ° °A good time to count movements is while you are resting, after having something to eat and drink. °How do I count fetal movements? °1. Find a quiet, comfortable area. Sit, or lie down on your side. °2. Write down the date, the start time and stop time, and the number of movements that you felt between those two times. Take this information with you to your health care visits. °3. For 2 hours, count kicks, flutters, swishes, rolls, and jabs. You should feel at least 10 movements during 2 hours. °4. You may stop counting after you have felt 10 movements. °5. If you do not feel 10 movements in 2 hours, have something to eat and drink. Then, keep resting and counting for 1 hour. If you feel at least 4 movements during that hour, you may stop counting. °Contact a health care provider if: °· You feel fewer than 4 movements in 2 hours. °· Your baby is not moving like he or she usually does. °Date: ____________ Start time: ____________ Stop time: ____________ Movements: ____________ °Date: ____________ Start time: ____________ Stop time: ____________ Movements: ____________ °Date: ____________ Start time: ____________ Stop time: ____________ Movements: ____________ °Date: ____________ Start time:  ____________ Stop time: ____________ Movements: ____________ °Date: ____________ Start time: ____________ Stop time: ____________ Movements: ____________ °Date: ____________ Start time: ____________ Stop time: ____________ Movements: ____________ °Date: ____________ Start time: ____________ Stop time: ____________ Movements: ____________ °Date: ____________ Start time: ____________ Stop time: ____________ Movements: ____________ °Date: ____________ Start time: ____________ Stop time: ____________ Movements: ____________ °This information is not intended to replace advice given to you by your health care provider. Make sure you discuss any questions you have with your health care provider. °Document Released: 10/21/2006 Document Revised: 05/20/2016 Document Reviewed: 10/31/2015 °Elsevier Interactive Patient Education © 2018 [employer] ° ° °Preterm Labor and Birth Information °Pregnancy normally lasts 39-41 weeks. Preterm labor is when labor starts early. It starts before you have been pregnant for 37 whole weeks. °What are the risk factors for preterm labor? °Preterm labor is more likely to occur in women who: °· Have an infection while pregnant. °· Have a cervix that is short. °· Have gone into preterm labor before. °· Have had surgery on their cervix. °· Are younger than age 17. °· Are older than age 35. °· Are African American. °· Are pregnant with two or more babies. °· Take street drugs while pregnant. °· Smoke while pregnant. °· Do not gain enough weight while pregnant. °· Got pregnant right after another pregnancy. ° °What are the symptoms of preterm labor? °Symptoms of preterm labor include: °· Cramps. The cramps may feel like the cramps some women get during their period. The cramps may happen with watery poop (diarrhea). °· Pain in the belly (abdomen). °· Pain in   the lower back. °· Regular contractions or tightening. It may feel like your belly is getting tighter. °· Pressure in the lower belly that  seems to get stronger. °· More fluid (discharge) leaking from the vagina. The fluid may be watery or bloody. °· Water breaking. ° °Why is it important to notice signs of preterm labor? °Babies who are born early may not be fully developed. They have a higher chance for: °· Long-term heart problems. °· Long-term lung problems. °· Trouble controlling body systems, like breathing. °· Bleeding in the brain. °· A condition called cerebral palsy. °· Learning difficulties. °· Death. ° °These risks are highest for babies who are born before 34 weeks of pregnancy. °How is preterm labor treated? °Treatment depends on: °· How long you were pregnant. °· Your condition. °· The health of your baby. ° °Treatment may involve: °· Having a stitch (suture) placed in your cervix. When you give birth, your cervix opens so the baby can come out. The stitch keeps the cervix from opening too soon. °· Staying at the hospital. °· Taking or getting medicines, such as: °? Hormone medicines. °? Medicines to stop contractions. °? Medicines to help the baby’s lungs develop. °? Medicines to prevent your baby from having cerebral palsy. ° °What should I do if I am in preterm labor? °If you think you are going into labor too soon, call your doctor right away. °How can I prevent preterm labor? °· Do not use any tobacco products. °? Examples of these are cigarettes, chewing tobacco, and e-cigarettes. °? If you need help quitting, ask your doctor. °· Do not use street drugs. °· Do not use any medicines unless you ask your doctor if they are safe for you. °· Talk with your doctor before taking any herbal supplements. °· Make sure you gain enough weight. °· Watch for infection. If you think you might have an infection, get it checked right away. °· If you have gone into preterm labor before, tell your doctor. °This information is not intended to replace advice given to you by your health care provider. Make sure you discuss any questions you have with  your health care provider. °Document Released: 12/18/2008 Document Revised: 03/03/2016 Document Reviewed: 02/12/2016 °Elsevier Interactive Patient Education © 2018 [employer] ° °

## 2017-05-24 NOTE — Progress Notes (Signed)
Pt given discharge instructions, verbalizes understanding, FOB at bedside for teaching, discharged home ambulatory

## 2017-05-25 ENCOUNTER — Ambulatory Visit (INDEPENDENT_AMBULATORY_CARE_PROVIDER_SITE_OTHER): Payer: 59 | Admitting: *Deleted

## 2017-05-25 VITALS — BP 125/52 | HR 82 | Wt 285.0 lb

## 2017-05-25 DIAGNOSIS — O403XX0 Polyhydramnios, third trimester, not applicable or unspecified: Secondary | ICD-10-CM | POA: Diagnosis not present

## 2017-05-28 ENCOUNTER — Other Ambulatory Visit: Payer: Self-pay | Admitting: Obstetrics & Gynecology

## 2017-05-28 ENCOUNTER — Other Ambulatory Visit: Payer: Self-pay | Admitting: Advanced Practice Midwife

## 2017-05-28 ENCOUNTER — Ambulatory Visit (HOSPITAL_COMMUNITY)
Admission: RE | Admit: 2017-05-28 | Discharge: 2017-05-28 | Disposition: A | Discharge status: Home / Self Care | Payer: 59 | Source: Ambulatory Visit | Attending: Obstetrics & Gynecology | Admitting: Obstetrics & Gynecology

## 2017-05-28 DIAGNOSIS — Z3A36 36 weeks gestation of pregnancy: Secondary | ICD-10-CM | POA: Diagnosis not present

## 2017-05-28 DIAGNOSIS — E669 Obesity, unspecified: Secondary | ICD-10-CM | POA: Insufficient documentation

## 2017-05-28 DIAGNOSIS — Z3480 Encounter for supervision of other normal pregnancy, unspecified trimester: Secondary | ICD-10-CM

## 2017-05-28 DIAGNOSIS — O09529 Supervision of elderly multigravida, unspecified trimester: Secondary | ICD-10-CM

## 2017-05-28 DIAGNOSIS — O3663X0 Maternal care for excessive fetal growth, third trimester, not applicable or unspecified: Secondary | ICD-10-CM | POA: Diagnosis present

## 2017-05-28 DIAGNOSIS — O26843 Uterine size-date discrepancy, third trimester: Secondary | ICD-10-CM | POA: Diagnosis not present

## 2017-05-28 DIAGNOSIS — Z0371 Encounter for suspected problem with amniotic cavity and membrane ruled out: Secondary | ICD-10-CM

## 2017-05-28 DIAGNOSIS — O99213 Obesity complicating pregnancy, third trimester: Secondary | ICD-10-CM

## 2017-05-28 DIAGNOSIS — O09523 Supervision of elderly multigravida, third trimester: Secondary | ICD-10-CM

## 2017-05-28 DIAGNOSIS — R002 Palpitations: Secondary | ICD-10-CM

## 2017-05-28 DIAGNOSIS — Z362 Encounter for other antenatal screening follow-up: Secondary | ICD-10-CM

## 2017-05-31 ENCOUNTER — Ambulatory Visit (INDEPENDENT_AMBULATORY_CARE_PROVIDER_SITE_OTHER): Payer: 59 | Admitting: Advanced Practice Midwife

## 2017-05-31 ENCOUNTER — Encounter: Payer: Self-pay | Admitting: Advanced Practice Midwife

## 2017-05-31 VITALS — BP 125/82 | HR 95 | Wt 285.0 lb

## 2017-05-31 DIAGNOSIS — Z3483 Encounter for supervision of other normal pregnancy, third trimester: Secondary | ICD-10-CM

## 2017-05-31 DIAGNOSIS — Z3480 Encounter for supervision of other normal pregnancy, unspecified trimester: Secondary | ICD-10-CM

## 2017-05-31 DIAGNOSIS — R8271 Bacteriuria: Secondary | ICD-10-CM

## 2017-05-31 DIAGNOSIS — O09523 Supervision of elderly multigravida, third trimester: Secondary | ICD-10-CM

## 2017-05-31 DIAGNOSIS — O403XX0 Polyhydramnios, third trimester, not applicable or unspecified: Secondary | ICD-10-CM

## 2017-05-31 DIAGNOSIS — O09529 Supervision of elderly multigravida, unspecified trimester: Secondary | ICD-10-CM

## 2017-05-31 NOTE — Patient Instructions (Signed)
Third Trimester of Pregnancy The third trimester is from week 28 through week 40 (months 7 through 9). The third trimester is a time when the unborn baby (fetus) is growing rapidly. At the end of the ninth month, the fetus is about 20 inches in length and weighs 6-10 pounds. Body changes during your third trimester Your body will continue to go through many changes during pregnancy. The changes vary from woman to woman. During the third trimester:  Your weight will continue to increase. You can expect to gain 25-35 pounds (11-16 kg) by the end of the pregnancy.  You may begin to get stretch marks on your hips, abdomen, and breasts.  You may urinate more often because the fetus is moving lower into your pelvis and pressing on your bladder.  You may develop or continue to have heartburn. This is caused by increased hormones that slow down muscles in the digestive tract.  You may develop or continue to have constipation because increased hormones slow digestion and cause the muscles that push waste through your intestines to relax.  You may develop hemorrhoids. These are swollen veins (varicose veins) in the rectum that can itch or be painful.  You may develop swollen, bulging veins (varicose veins) in your legs.  You may have increased body aches in the pelvis, back, or thighs. This is due to weight gain and increased hormones that are relaxing your joints.  You may have changes in your hair. These can include thickening of your hair, rapid growth, and changes in texture. Some women also have hair loss during or after pregnancy, or hair that feels dry or thin. Your hair will most likely return to normal after your baby is born.  Your breasts will continue to grow and they will continue to become tender. A yellow fluid (colostrum) may leak from your breasts. This is the first milk you are producing for your baby.  Your belly button may stick out.  You may notice more swelling in your hands,  face, or ankles.  You may have increased tingling or numbness in your hands, arms, and legs. The skin on your belly may also feel numb.  You may feel short of breath because of your expanding uterus.  You may have more problems sleeping. This can be caused by the size of your belly, increased need to urinate, and an increase in your body's metabolism.  You may notice the fetus "dropping," or moving lower in your abdomen (lightening).  You may have increased vaginal discharge.  You may notice your joints feel loose and you may have pain around your pelvic bone.  What to expect at prenatal visits You will have prenatal exams every 2 weeks until week 36. Then you will have weekly prenatal exams. During a routine prenatal visit:  You will be weighed to make sure you and the baby are growing normally.  Your blood pressure will be taken.  Your abdomen will be measured to track your baby's growth.  The fetal heartbeat will be listened to.  Any test results from the previous visit will be discussed.  You may have a cervical check near your due date to see if your cervix has softened or thinned (effaced).  You will be tested for Group B streptococcus. This happens between 35 and 37 weeks.  Your health care provider may ask you:  What your birth plan is.  How you are feeling.  If you are feeling the baby move.  If you have had   any abnormal symptoms, such as leaking fluid, bleeding, severe headaches, or abdominal cramping.  If you are using any tobacco products, including cigarettes, chewing tobacco, and electronic cigarettes.  If you have any questions.  Other tests or screenings that may be performed during your third trimester include:  Blood tests that check for low iron levels (anemia).  Fetal testing to check the health, activity level, and growth of the fetus. Testing is done if you have certain medical conditions or if there are problems during the  pregnancy.  Nonstress test (NST). This test checks the health of your baby to make sure there are no signs of problems, such as the baby not getting enough oxygen. During this test, a belt is placed around your belly. The baby is made to move, and its heart rate is monitored during movement.  What is false labor? False labor is a condition in which you feel small, irregular tightenings of the muscles in the womb (contractions) that usually go away with rest, changing position, or drinking water. These are called Braxton Hicks contractions. Contractions may last for hours, days, or even weeks before true labor sets in. If contractions come at regular intervals, become more frequent, increase in intensity, or become painful, you should see your health care provider. What are the signs of labor?  Abdominal cramps.  Regular contractions that start at 10 minutes apart and become stronger and more frequent with time.  Contractions that start on the top of the uterus and spread down to the lower abdomen and back.  Increased pelvic pressure and dull back pain.  A watery or bloody mucus discharge that comes from the vagina.  Leaking of amniotic fluid. This is also known as your "water breaking." It could be a slow trickle or a gush. Let your health care provider know if it has a color or strange odor. If you have any of these signs, call your health care provider right away, even if it is before your due date. Follow these instructions at home: Medicines  Follow your health care provider's instructions regarding medicine use. Specific medicines may be either safe or unsafe to take during pregnancy.  Take a prenatal vitamin that contains at least 600 micrograms (mcg) of folic acid.  If you develop constipation, try taking a stool softener if your health care provider approves. Eating and drinking  Eat a balanced diet that includes fresh fruits and vegetables, whole grains, good sources of protein  such as meat, eggs, or tofu, and low-fat dairy. Your health care provider will help you determine the amount of weight gain that is right for you.  Avoid raw meat and uncooked cheese. These carry germs that can cause birth defects in the baby.  If you have low calcium intake from food, talk to your health care provider about whether you should take a daily calcium supplement.  Eat four or five small meals rather than three large meals a day.  Limit foods that are high in fat and processed sugars, such as fried and sweet foods.  To prevent constipation: ? Drink enough fluid to keep your urine clear or pale yellow. ? Eat foods that are high in fiber, such as fresh fruits and vegetables, whole grains, and beans. Activity  Exercise only as directed by your health care provider. Most women can continue their usual exercise routine during pregnancy. Try to exercise for 30 minutes at least 5 days a week. Stop exercising if you experience uterine contractions.  Avoid heavy   lifting.  Do not exercise in extreme heat or humidity, or at high altitudes.  Wear low-heel, comfortable shoes.  Practice good posture.  You may continue to have sex unless your health care provider tells you otherwise. Relieving pain and discomfort  Take frequent breaks and rest with your legs elevated if you have leg cramps or low back pain.  Take warm sitz baths to soothe any pain or discomfort caused by hemorrhoids. Use hemorrhoid cream if your health care provider approves.  Wear a good support bra to prevent discomfort from breast tenderness.  If you develop varicose veins: ? Wear support pantyhose or compression stockings as told by your healthcare provider. ? Elevate your feet for 15 minutes, 3-4 times a day. Prenatal care  Write down your questions. Take them to your prenatal visits.  Keep all your prenatal visits as told by your health care provider. This is important. Safety  Wear your seat belt at  all times when driving.  Make a list of emergency phone numbers, including numbers for family, friends, the hospital, and police and fire departments. General instructions  Avoid cat litter boxes and soil used by cats. These carry germs that can cause birth defects in the baby. If you have a cat, ask someone to clean the litter box for you.  Do not travel far distances unless it is absolutely necessary and only with the approval of your health care provider.  Do not use hot tubs, steam rooms, or saunas.  Do not drink alcohol.  Do not use any products that contain nicotine or tobacco, such as cigarettes and e-cigarettes. If you need help quitting, ask your health care provider.  Do not use any medicinal herbs or unprescribed drugs. These chemicals affect the formation and growth of the baby.  Do not douche or use tampons or scented sanitary pads.  Do not cross your legs for long periods of time.  To prepare for the arrival of your baby: ? Take prenatal classes to understand, practice, and ask questions about labor and delivery. ? Make a trial run to the hospital. ? Visit the hospital and tour the maternity area. ? Arrange for maternity or paternity leave through employers. ? Arrange for family and friends to take care of pets while you are in the hospital. ? Purchase a rear-facing car seat and make sure you know how to install it in your car. ? Pack your hospital bag. ? Prepare the baby's nursery. Make sure to remove all pillows and stuffed animals from the baby's crib to prevent suffocation.  Visit your dentist if you have not gone during your pregnancy. Use a soft toothbrush to brush your teeth and be gentle when you floss. Contact a health care provider if:  You are unsure if you are in labor or if your water has broken.  You become dizzy.  You have mild pelvic cramps, pelvic pressure, or nagging pain in your abdominal area.  You have lower back pain.  You have persistent  nausea, vomiting, or diarrhea.  You have an unusual or bad smelling vaginal discharge.  You have pain when you urinate. Get help right away if:  Your water breaks before 37 weeks.  You have regular contractions less than 5 minutes apart before 37 weeks.  You have a fever.  You are leaking fluid from your vagina.  You have spotting or bleeding from your vagina.  You have severe abdominal pain or cramping.  You have rapid weight loss or weight gain.    You have shortness of breath with chest pain.  You notice sudden or extreme swelling of your face, hands, ankles, feet, or legs.  Your baby makes fewer than 10 movements in 2 hours.  You have severe headaches that do not go away when you take medicine.  You have vision changes. Summary  The third trimester is from week 28 through week 40, months 7 through 9. The third trimester is a time when the unborn baby (fetus) is growing rapidly.  During the third trimester, your discomfort may increase as you and your baby continue to gain weight. You may have abdominal, leg, and back pain, sleeping problems, and an increased need to urinate.  During the third trimester your breasts will keep growing and they will continue to become tender. A yellow fluid (colostrum) may leak from your breasts. This is the first milk you are producing for your baby.  False labor is a condition in which you feel small, irregular tightenings of the muscles in the womb (contractions) that eventually go away. These are called Braxton Hicks contractions. Contractions may last for hours, days, or even weeks before true labor sets in.  Signs of labor can include: abdominal cramps; regular contractions that start at 10 minutes apart and become stronger and more frequent with time; watery or bloody mucus discharge that comes from the vagina; increased pelvic pressure and dull back pain; and leaking of amniotic fluid. This information is not intended to replace advice  given to you by your health care provider. Make sure you discuss any questions you have with your health care provider. Document Released: 09/15/2001 Document Revised: 02/27/2016 Document Reviewed: 11/22/2012 Elsevier Interactive Patient Education  2017 [employer]  

## 2017-05-31 NOTE — Progress Notes (Signed)
   PRENATAL VISIT NOTE  Subjective:  Destiny Joseph is a 36 y.o. [id number] at [redacted]w[redacted]d being seen today for ongoing prenatal care.  She is currently monitored for the following issues for this high-risk pregnancy and has Supervision of normal pregnancy, antepartum; AMA; History of abnormal cervical Pap smear; Obesity in pregnancy; Rubella non-immune status, antepartum; Pap smear of cervix with ASCUS, cannot exclude HGSIL; High risk HPV infection; Group B streptococcus urinary tract infection affecting pregnancy; Palpitations; Murmur, cardiac; Macrosomia affecting management of mother in third trimester; Suspected polyhydramnios not found; and Preterm labor on her problem list.  Patient reports occasional contractions.  Contractions: Irregular. Vag. Bleeding: None.  Movement: Present. Denies leaking of fluid.   The following portions of the patient's history were reviewed and updated as appropriate: allergies, current medications, past family history, past medical history, past social history, past surgical history and problem list. Problem list updated.  Objective:   Vitals:   05/31/17 0922 05/31/17 0925  BP: 125/82 125/82  Pulse: 95 95  Weight: 285 lb (129.3 kg)     Fetal Status: Fetal Heart Rate (bpm): NST-R   Movement: Present     General:  Alert, oriented and cooperative. Patient is in no acute distress.  Skin: Skin is warm and dry. No rash noted.   Cardiovascular: Normal heart rate noted  Respiratory: Normal respiratory effort, no problems with respiration noted  Abdomen: Soft, gravid, appropriate for gestational age.  Pain/Pressure: Present     Pelvic: Cervical exam deferred       per pt  Extremities: Normal range of motion.  Edema: None  Mental Status:  Normal mood and affect. Normal behavior. Normal judgment and thought content.   Assessment and Plan:  Pregnancy: [id number] at [redacted]w[redacted]d  Patient Active Problem List   Diagnosis Date Noted  . Preterm labor 05/23/2017  . Suspected  polyhydramnios not found 05/21/2017  . Macrosomia affecting management of mother in third trimester 05/12/2017  . Palpitations 05/06/2017  . Murmur, cardiac 05/06/2017  . Group B streptococcus urinary tract infection affecting pregnancy 02/21/2017  . Pap smear of cervix with ASCUS, cannot exclude HGSIL 01/11/2017  . 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 Rubella non-immune status, antepartum 11/20/2016  . Obesity in pregnancy 11/19/2016  . Supervision of normal pregnancy, antepartum 11/16/2016  . AMA 11/16/2016  . History of abnormal cervical Pap smear 11/16/2016   Pelvis proven to 8+13 EFW 8+9 at 36/6wks Declines sweeping due to GBS  Term labor symptoms and general obstetric precautions including but not limited to vaginal bleeding, contractions, leaking of fluid and fetal movement were reviewed in detail with the patient. Please refer to After Visit Summary for other counseling recommendations.  RTO 1 wk  Wynelle Bourgeois, CNM

## 2017-06-03 ENCOUNTER — Ambulatory Visit (INDEPENDENT_AMBULATORY_CARE_PROVIDER_SITE_OTHER): Payer: 59 | Admitting: *Deleted

## 2017-06-03 VITALS — BP 118/60 | Wt 287.0 lb

## 2017-06-03 DIAGNOSIS — O09523 Supervision of elderly multigravida, third trimester: Secondary | ICD-10-CM

## 2017-06-03 DIAGNOSIS — O403XX0 Polyhydramnios, third trimester, not applicable or unspecified: Secondary | ICD-10-CM

## 2017-06-08 ENCOUNTER — Ambulatory Visit (INDEPENDENT_AMBULATORY_CARE_PROVIDER_SITE_OTHER): Payer: 59 | Admitting: *Deleted

## 2017-06-08 VITALS — BP 115/60 | HR 80

## 2017-06-08 DIAGNOSIS — O403XX0 Polyhydramnios, third trimester, not applicable or unspecified: Secondary | ICD-10-CM | POA: Diagnosis not present

## 2017-06-09 ENCOUNTER — Ambulatory Visit (INDEPENDENT_AMBULATORY_CARE_PROVIDER_SITE_OTHER): Payer: 59 | Admitting: Obstetrics & Gynecology

## 2017-06-09 ENCOUNTER — Other Ambulatory Visit: Payer: Self-pay | Admitting: Advanced Practice Midwife

## 2017-06-09 DIAGNOSIS — Z3480 Encounter for supervision of other normal pregnancy, unspecified trimester: Secondary | ICD-10-CM

## 2017-06-09 DIAGNOSIS — Z3483 Encounter for supervision of other normal pregnancy, third trimester: Secondary | ICD-10-CM

## 2017-06-09 NOTE — Progress Notes (Signed)
Wants to discuss an induction. Husband works over 2 hours out of town and they are worried about him being home--Deanna Dance movement psychotherapistola    PRENATAL VISIT NOTE  Subjective:  Destiny Joseph is a 36 y.o. [id number] at 6034w4d being seen today for ongoing prenatal care.  She is currently monitored for the following issues for this high-risk pregnancy and has Supervision of normal pregnancy, antepartum; AMA; History of abnormal cervical Pap smear; Obesity in pregnancy; Rubella non-immune status, antepartum; Pap smear of cervix with ASCUS, cannot exclude HGSIL; High risk HPV infection; Group B streptococcus urinary tract infection affecting pregnancy; Palpitations; Murmur, cardiac; Macrosomia affecting management of mother in third trimester; Suspected polyhydramnios not found; and Preterm labor on her problem list.  Patient reports no complaints.  Contractions: Irregular. Vag. Bleeding: None.  Movement: Present. Denies leaking of fluid.   The following portions of the patient's history were reviewed and updated as appropriate: allergies, current medications, past family history, past medical history, past social history, past surgical history and problem list. Problem list updated.  Objective:   Vitals:   06/09/17 0922  BP: 103/62  Pulse: 75  Weight: 289 lb (131.1 kg)    Fetal Status: Fetal Heart Rate (bpm): 149 Fundal Height: 43 cm Movement: Present  Presentation: Vertex  General:  Alert, oriented and cooperative. Patient is in no acute distress.  Skin: Skin is warm and dry. No rash noted.   Cardiovascular: Normal heart rate noted  Respiratory: Normal respiratory effort, no problems with respiration noted  Abdomen: Soft, gravid, appropriate for gestational age.  Pain/Pressure: Present     Pelvic: Cervical exam performed Dilation: 3 Effacement (%): 50 Station: -3  Extremities: Normal range of motion.  Edema: None  Mental Status:  Normal mood and affect. Normal behavior. Normal judgment and thought  content.   Assessment and Plan:  Pregnancy: [id number] at 2334w4d  1. Supervision of other normal pregnancy, antepartum -macrosomia on US.  Pt desires to be induced at 39 weeks to avoid an even larger baby and shoulder dystocia.  Husband works out of town and would like him present.   -Adine MaduraJim Arnold is covering L & D on 06/12/17 and agrees for induction.  Cervix favorable and pelvis proven to 8 lb 13 oz.  Term labor symptoms and general obstetric precautions including but not limited to vaginal bleeding, contractions, leaking of fluid and fetal movement were reviewed in detail with the patient. Please refer to After Visit Summary for other counseling recommendations.  Return in about 6 weeks (around 07/21/2017).   Elsie LincolnKelly Zhamir Pirro, MD

## 2017-06-11 ENCOUNTER — Other Ambulatory Visit: Payer: 59

## 2017-06-11 ENCOUNTER — Encounter: Payer: 59 | Admitting: Advanced Practice Midwife

## 2017-06-12 ENCOUNTER — Inpatient Hospital Stay (HOSPITAL_COMMUNITY): Payer: 59 | Admitting: Anesthesiology

## 2017-06-12 ENCOUNTER — Inpatient Hospital Stay (HOSPITAL_COMMUNITY)
Admission: AD | Admit: 2017-06-12 | Discharge: 2017-06-14 | DRG: 767 | Disposition: A | Discharge status: Home / Self Care | Payer: 59 | Source: Ambulatory Visit | Attending: Obstetrics & Gynecology | Admitting: Obstetrics & Gynecology

## 2017-06-12 ENCOUNTER — Inpatient Hospital Stay (HOSPITAL_COMMUNITY): Admission: RE | Admit: 2017-06-12 | Payer: 59 | Source: Ambulatory Visit

## 2017-06-12 ENCOUNTER — Encounter (HOSPITAL_COMMUNITY): Payer: Self-pay | Admitting: *Deleted

## 2017-06-12 DIAGNOSIS — O99214 Obesity complicating childbirth: Secondary | ICD-10-CM | POA: Diagnosis present

## 2017-06-12 DIAGNOSIS — Z6841 Body Mass Index (BMI) 40.0 and over, adult: Secondary | ICD-10-CM | POA: Diagnosis not present

## 2017-06-12 DIAGNOSIS — O9989 Other specified diseases and conditions complicating pregnancy, childbirth and the puerperium: Secondary | ICD-10-CM

## 2017-06-12 DIAGNOSIS — Z302 Encounter for sterilization: Secondary | ICD-10-CM

## 2017-06-12 DIAGNOSIS — O09899 Supervision of other high risk pregnancies, unspecified trimester: Secondary | ICD-10-CM

## 2017-06-12 DIAGNOSIS — O3663X0 Maternal care for excessive fetal growth, third trimester, not applicable or unspecified: Secondary | ICD-10-CM | POA: Diagnosis not present

## 2017-06-12 DIAGNOSIS — Z3A39 39 weeks gestation of pregnancy: Secondary | ICD-10-CM

## 2017-06-12 DIAGNOSIS — Z283 Underimmunization status: Secondary | ICD-10-CM

## 2017-06-12 DIAGNOSIS — O99824 Streptococcus B carrier state complicating childbirth: Secondary | ICD-10-CM | POA: Diagnosis present

## 2017-06-12 DIAGNOSIS — Z3480 Encounter for supervision of other normal pregnancy, unspecified trimester: Secondary | ICD-10-CM

## 2017-06-12 DIAGNOSIS — O99210 Obesity complicating pregnancy, unspecified trimester: Secondary | ICD-10-CM

## 2017-06-12 DIAGNOSIS — Z0371 Encounter for suspected problem with amniotic cavity and membrane ruled out: Secondary | ICD-10-CM

## 2017-06-12 DIAGNOSIS — E669 Obesity, unspecified: Secondary | ICD-10-CM | POA: Diagnosis present

## 2017-06-12 DIAGNOSIS — Z9851 Tubal ligation status: Secondary | ICD-10-CM

## 2017-06-12 DIAGNOSIS — O2343 Unspecified infection of urinary tract in pregnancy, third trimester: Secondary | ICD-10-CM

## 2017-06-12 DIAGNOSIS — B951 Streptococcus, group B, as the cause of diseases classified elsewhere: Secondary | ICD-10-CM

## 2017-06-12 LAB — TYPE AND SCREEN
ABO/RH(D): O POS
Antibody Screen: NEGATIVE

## 2017-06-12 LAB — CBC
HCT: 33.6 % — ABNORMAL LOW (ref 36.0–46.0)
Hemoglobin: 10.6 g/dL — ABNORMAL LOW (ref 12.0–15.0)
MCH: 24.7 pg — AB (ref 26.0–34.0)
MCHC: 31.5 g/dL (ref 30.0–36.0)
MCV: 78.3 fL (ref 78.0–100.0)
PLATELETS: 268 10*3/uL (ref 150–400)
RBC: 4.29 MIL/uL (ref 3.87–5.11)
RDW: 15.2 % (ref 11.5–15.5)
WBC: 9.3 10*3/uL (ref 4.0–10.5)

## 2017-06-12 LAB — RPR: RPR Ser Ql: NONREACTIVE

## 2017-06-12 MED ORDER — DIPHENHYDRAMINE HCL 50 MG/ML IJ SOLN: 12.5000 mg | INTRAMUSCULAR | Status: DC | PRN

## 2017-06-12 MED ORDER — ZOLPIDEM TARTRATE 5 MG PO TABS: 5.0000 mg | ORAL_TABLET | Freq: Every evening | ORAL | Status: DC | PRN

## 2017-06-12 MED ORDER — LIDOCAINE-EPINEPHRINE (PF) 2 %-1:200000 IJ SOLN: INTRAMUSCULAR | Status: DC | PRN

## 2017-06-12 MED ORDER — SODIUM BICARBONATE 8.4 % IV SOLN
INTRAVENOUS | Status: DC | PRN
  Administered 2017-06-12: 3 mL via EPIDURAL
  Administered 2017-06-12: 4 mL via EPIDURAL

## 2017-06-12 MED ORDER — ACETAMINOPHEN 325 MG PO TABS
650.0000 mg | ORAL_TABLET | ORAL | Status: DC | PRN
  Administered 2017-06-12 – 2017-06-14 (×4): 650 mg via ORAL
  Filled 2017-06-12 (×4): qty 2

## 2017-06-12 MED ORDER — PHENYLEPHRINE 40 MCG/ML (10ML) SYRINGE FOR IV PUSH (FOR BLOOD PRESSURE SUPPORT): 80.0000 ug | PREFILLED_SYRINGE | INTRAVENOUS | Status: DC | PRN

## 2017-06-12 MED ORDER — DIBUCAINE 1 % RE OINT
1.0000 | TOPICAL_OINTMENT | RECTAL | Status: DC | PRN
Start: 2017-06-12 — End: 2017-06-14

## 2017-06-12 MED ORDER — LACTATED RINGERS IV SOLN
500.0000 mL | INTRAVENOUS | Status: DC | PRN
  Administered 2017-06-12: 1000 mL via INTRAVENOUS

## 2017-06-12 MED ORDER — LACTATED RINGERS IV SOLN
INTRAVENOUS | Status: DC
  Administered 2017-06-12 (×2): via INTRAVENOUS

## 2017-06-12 MED ORDER — EPHEDRINE 5 MG/ML INJ: 10.0000 mg | INTRAVENOUS | Status: DC | PRN

## 2017-06-12 MED ORDER — PENICILLIN G POTASSIUM 5000000 UNITS IJ SOLR
5.0000 10*6.[IU] | Freq: Once | INTRAVENOUS | Status: AC
  Administered 2017-06-12: 5 10*6.[IU] via INTRAVENOUS
  Filled 2017-06-12: qty 5

## 2017-06-12 MED ORDER — WITCH HAZEL-GLYCERIN EX PADS: 1.0000 "application " | MEDICATED_PAD | CUTANEOUS | Status: DC | PRN

## 2017-06-12 MED ORDER — OXYCODONE-ACETAMINOPHEN 5-325 MG PO TABS: 2.0000 | ORAL_TABLET | ORAL | Status: DC | PRN

## 2017-06-12 MED ORDER — TERBUTALINE SULFATE 1 MG/ML IJ SOLN: 0.2500 mg | Freq: Once | INTRAMUSCULAR | Status: DC | PRN

## 2017-06-12 MED ORDER — ONDANSETRON HCL 4 MG/2ML IJ SOLN
4.0000 mg | Freq: Four times a day (QID) | INTRAMUSCULAR | Status: DC | PRN
  Filled 2017-06-12: qty 2

## 2017-06-12 MED ORDER — COCONUT OIL OIL
1.0000 | TOPICAL_OIL | Status: DC | PRN
Start: 2017-06-12 — End: 2017-06-14

## 2017-06-12 MED ORDER — DIPHENHYDRAMINE HCL 25 MG PO CAPS: 25.0000 mg | ORAL_CAPSULE | Freq: Four times a day (QID) | ORAL | Status: DC | PRN

## 2017-06-12 MED ORDER — PRENATAL MULTIVITAMIN CH
1.0000 | ORAL_TABLET | Freq: Every day | ORAL | Status: DC
  Administered 2017-06-14: 1 via ORAL
  Filled 2017-06-12: qty 1

## 2017-06-12 MED ORDER — FENTANYL CITRATE (PF) 100 MCG/2ML IJ SOLN: 100.0000 ug | INTRAMUSCULAR | Status: DC | PRN

## 2017-06-12 MED ORDER — IBUPROFEN 600 MG PO TABS
600.0000 mg | ORAL_TABLET | Freq: Four times a day (QID) | ORAL | Status: DC
  Administered 2017-06-12 – 2017-06-14 (×6): 600 mg via ORAL
  Filled 2017-06-12 (×6): qty 1

## 2017-06-12 MED ORDER — OXYTOCIN 40 UNITS IN LACTATED RINGERS INFUSION - SIMPLE MED: 2.5000 [IU]/h | INTRAVENOUS | Status: DC

## 2017-06-12 MED ORDER — OXYCODONE-ACETAMINOPHEN 5-325 MG PO TABS: 1.0000 | ORAL_TABLET | ORAL | Status: DC | PRN

## 2017-06-12 MED ORDER — SENNOSIDES-DOCUSATE SODIUM 8.6-50 MG PO TABS
2.0000 | ORAL_TABLET | ORAL | Status: DC
  Administered 2017-06-14: 2 via ORAL
  Filled 2017-06-12 (×2): qty 2

## 2017-06-12 MED ORDER — TETANUS-DIPHTH-ACELL PERTUSSIS 5-2.5-18.5 LF-MCG/0.5 IM SUSP
0.5000 mL | Freq: Once | INTRAMUSCULAR | Status: DC
  Filled 2017-06-12: qty 0.5

## 2017-06-12 MED ORDER — LACTATED RINGERS IV SOLN: 500.0000 mL | Freq: Once | INTRAVENOUS | Status: DC

## 2017-06-12 MED ORDER — OXYTOCIN BOLUS FROM INFUSION
500.0000 mL | Freq: Once | INTRAVENOUS | Status: AC
  Administered 2017-06-12: 500 mL via INTRAVENOUS

## 2017-06-12 MED ORDER — FENTANYL 2.5 MCG/ML BUPIVACAINE 1/10 % EPIDURAL INFUSION (WH - ANES)
12.0000 mL/h | INTRAMUSCULAR | Status: DC | PRN
  Administered 2017-06-12: 12 mL/h via EPIDURAL
  Filled 2017-06-12: qty 100

## 2017-06-12 MED ORDER — BENZOCAINE-MENTHOL 20-0.5 % EX AERO
1.0000 "application " | INHALATION_SPRAY | CUTANEOUS | Status: DC | PRN
  Filled 2017-06-12: qty 56

## 2017-06-12 MED ORDER — SOD CITRATE-CITRIC ACID 500-334 MG/5ML PO SOLN: 30.0000 mL | ORAL | Status: DC | PRN

## 2017-06-12 MED ORDER — PHENYLEPHRINE 40 MCG/ML (10ML) SYRINGE FOR IV PUSH (FOR BLOOD PRESSURE SUPPORT)
80.0000 ug | PREFILLED_SYRINGE | INTRAVENOUS | Status: DC | PRN
  Filled 2017-06-12: qty 10

## 2017-06-12 MED ORDER — ONDANSETRON HCL 4 MG/2ML IJ SOLN: 4.0000 mg | INTRAMUSCULAR | Status: DC | PRN

## 2017-06-12 MED ORDER — SIMETHICONE 80 MG PO CHEW: 80.0000 mg | CHEWABLE_TABLET | ORAL | Status: DC | PRN

## 2017-06-12 MED ORDER — OXYTOCIN 40 UNITS IN LACTATED RINGERS INFUSION - SIMPLE MED
1.0000 m[IU]/min | INTRAVENOUS | Status: DC
  Administered 2017-06-12: 2 m[IU]/min via INTRAVENOUS
  Filled 2017-06-12: qty 1000

## 2017-06-12 MED ORDER — LIDOCAINE HCL (PF) 1 % IJ SOLN
30.0000 mL | INTRAMUSCULAR | Status: DC | PRN
  Filled 2017-06-12: qty 30

## 2017-06-12 MED ORDER — ONDANSETRON HCL 4 MG PO TABS: 4.0000 mg | ORAL_TABLET | ORAL | Status: DC | PRN

## 2017-06-12 MED ORDER — PENICILLIN G POT IN DEXTROSE 60000 UNIT/ML IV SOLN
3.0000 10*6.[IU] | INTRAVENOUS | Status: DC
  Administered 2017-06-12 (×2): 3 10*6.[IU] via INTRAVENOUS
  Filled 2017-06-12 (×4): qty 50

## 2017-06-12 MED ORDER — ACETAMINOPHEN 325 MG PO TABS: 650.0000 mg | ORAL_TABLET | ORAL | Status: DC | PRN

## 2017-06-12 NOTE — MAU Note (Signed)
Started leaking fld about 0540 and have continued to leak. Clear fld. Some ctxs but not bad. 3cm SVE. For IOL at 0800 due to large baby

## 2017-06-12 NOTE — H&P (Signed)
OBSTETRIC ADMISSION HISTORY AND PHYSICAL  Destiny Joseph is a 36 y.o. female [phone] with IUP at [redacted]w[redacted]d by LMP presenting for elective IOL for macrosomia at term.  She reports that her fluid ruptured this morning around 0500 prompting her to arrive early to the hospital.  She reports +FMs, no VB, no blurry vision, headaches or peripheral edema or RUQ pain.  This pregnancy complicated by AMA, rubella non-immune status, GBS bacteruria, borderline polyhydramnios that resolved, preterm labor and maternal palpitations.  She reports she has had ongoing intermittent episodes of elevated heart rate; the most recent episode was yesterday.  She denies chest pain or shortness of breath.  She was scheduled for Holter monitor approximately three weeks ago to evaluate but never had this completed.  She has no palpitations currently. Macrosomia on [place].  Pt desired to be induced at 39 weeks to avoid an even larger baby and shoulder dystocia. She plans on bottle feeding. She request BTL for birth control.   She received her prenatal care at [facility]   Dating: By LMP consistent with 1st trimester U/S --->  Estimated Date of Delivery: 06/19/17  Sono:   , CWD, normal anatomy, Cephalic presentation, 3895g, >45% EFW, AC >97%   Prenatal History/Complications:  Clinic  [facility] Prenatal Labs  Dating LMP, consistent with 1st trimester [place] Blood type: O/POS/-- (02/12 1104)   Genetic Screen Quad negative Antibody:NEG (02/12 1104)  Anatomic [place] Nml female, but incomplete,  f/u Nml Rubella: <0.90 (02/12 1104)  GTT Early:   5.0 A1C            Third trimester: 81,153,100 RPR: NON REAC (02/12 1104)   Flu vaccine  Desires HBsAg: NEGATIVE (02/12 1104)   TDaP vaccine 03/31/17                               HIV: NONREACTIVE (02/12 1104)   Baby Food   Bottle                                             GBS: Positive  Contraception  BTS Pap:  ASCUS, +HRHPV > recommend coloposcopy postpartum  Circumcision female Colpo 4/18:   Pediatrician  [facility]   Support Person  Molly Maduro FOB      Past Medical History: Past Medical History:  Diagnosis Date  . Anxiety   . Vaginal Pap smear, abnormal    Required cryotherapy    Past Surgical History: Past Surgical History:  Procedure Laterality Date  . DILATION AND CURETTAGE OF UTERUS    . foot cyst removal Left     Obstetrical History: OB History    Gravida Para Term Preterm AB Living   SAB TAB Ectopic Multiple Live Births   1       2      Social History: Social History   Social History  . Marital status: Single    Spouse name: N/A  . Number of children: N/A  . Years of education: N/A   Occupational History  . photographer    Social History Main Topics  . Smoking status: Never Smoker  . Smokeless tobacco: Never Used  . Alcohol use No  . Drug use: No  . Sexual activity: Yes    Birth control/ protection: None  Other Topics Concern  . None   Social History Narrative  . None    Family History: Family History  Problem Relation Age of Onset  . Breast cancer Mother [id number]  . Ovarian cysts Mother   . Hypertension Father   . Diabetes Father   . Bipolar disorder Sister   . Breast cancer Maternal Aunt 40    Allergies: No Known Allergies  Prescriptions Prior to Admission  Medication Sig Dispense Refill Last Dose  . calcium carbonate (TUMS - DOSED IN MG ELEMENTAL CALCIUM) 500 MG chewable tablet Chew 2 tablets by mouth 3 (three) times daily as needed for indigestion or heartburn.   06/11/2017 at Unknown time  . fluticasone (FLONASE) 50 MCG/ACT nasal spray Place 1 spray into both nostrils 2 (two) times daily.    06/12/2017 at Unknown time  . loratadine (CLARITIN) 10 MG tablet Take 1 tablet (10 mg total) by mouth daily. 30 tablet 2 06/12/2017 at Unknown time  . Oxymetazoline HCl (AFRIN SINUS NA) Place 1-2 sprays into the nose daily.   06/11/2017 at Unknown time     Review of Systems   All systems reviewed and negative except as  stated in HPI  Blood pressure 117/68, pulse 83, temperature 98 F (36.7 C), temperature source Oral, resp. rate 18, height 5\' 9"  (1.753 m), weight 131.1 kg (289 lb), last menstrual period 09/12/2016. General appearance: alert and cooperative Lungs: clear to auscultation bilaterally Heart: regular rate and rhythm, no murmur Abdomen: soft, non-tender; bowel sounds normal Pelvic: deferred Extremities: Homans sign is negative, no sign of DVT Presentation: cephalic Fetal monitoring baseline 140, moderate variability, +accels, no decels Uterine activity regular, q3 min Dilation: 3 Effacement (%): 50 Station: -2, -3 Exam by:: Ferne CoeS Earl RNC   Prenatal labs: ABO, Rh: --/--/O POS (09/08 0730) Antibody: NEG (09/08 0730) Rubella: <0.90 (02/12 1104) RPR: Non Reactive (08/19 1745)  HBsAg: NEGATIVE (02/12 1104)  HIV: NONREACTIVE (06/27 0823)  GBS:   POSITIVE 2 hr Glucola: WNL   Genetic screening: MetallurgistQuad screen Anatomy US: WNL, female  Prenatal Transfer Tool  Maternal Diabetes: No Genetic Screening: Normal Maternal Ultrasounds/Referrals: Normal Fetal Ultrasounds or other Referrals:  None Maternal Substance Abuse:  No Significant Maternal Medications:  None Significant Maternal Lab Results: Lab values include: Group B Strep positive  Results for orders placed or performed during the hospital encounter of 06/12/17 (from the past 24 hour(s))  CBC   Collection Time: 06/12/17  7:30 AM  Result Value Ref Range   WBC 9.3 4.0 - 10.5 K/uL   RBC 4.29 3.87 - 5.11 MIL/uL   Hemoglobin 10.6 (L) 12.0 - 15.0 g/dL   HCT 16.133.6 (L) 09.636.0 - 04.546.0 %   MCV 78.3 78.0 - 100.0 fL   MCH 24.7 (L) 26.0 - 34.0 pg   MCHC 31.5 30.0 - 36.0 g/dL   RDW 40.915.2 81.111.5 - 91.415.5 %   Platelets 268 150 - 400 K/uL  Type and screen   Collection Time: 06/12/17  7:30 AM  Result Value Ref Range   ABO/RH(D) O POS    Antibody Screen NEG    Sample Expiration 06/15/2017     Patient Active Problem List   Diagnosis Date Noted  .  Preterm labor 05/23/2017  . Suspected polyhydramnios not found 05/21/2017  . Macrosomia affecting management of mother in third trimester 05/12/2017  . Palpitations 05/06/2017  . Murmur, cardiac 05/06/2017  . Group B streptococcus urinary tract infection affecting pregnancy 02/21/2017  . Pap smear of cervix with ASCUS,  cannot exclude HGSIL 01/11/2017  . 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 Rubella non-immune status, antepartum 11/20/2016  . Obesity in pregnancy 11/19/2016  . Supervision of normal pregnancy, antepartum 11/16/2016  . AMA 11/16/2016  . History of abnormal cervical Pap smear 11/16/2016    Assessment/Plan:  Destiny Joseph is a 36 y.o. [id number] at [redacted]w[redacted]d here for elective IOL secondary to macrosomia.  She had SROM at 0540.  #Labor: Monitor on pitocin #Pain: Epidural when desired #FWB: Category 1 #ID:  GBS positive on PCN  #MOF: Bottle #MOC: BTL #Circ:  N/A  Expect NSVD.  Jearld Lesch Key, DO PGY-2 06/12/2017, 10:27 AM  OB FELLOW HISTORY AND PHYSICAL ATTESTATION  I confirm that I have verified the information documented in the resident's note and that I have also personally reperformed the physical exam and all medical decision making activities. I agree with above documentation and have made edits as needed.   Caryl Ada OB Fellow 06/12/2017, 4:32 PM

## 2017-06-12 NOTE — Anesthesia Procedure Notes (Signed)
Epidural Patient location during procedure: OB  Staffing Anesthesiologist: Odette FractionHARKINS, Alexandr Yaworski Performed: anesthesiologist   Preanesthetic Checklist Completed: patient identified, site marked, surgical consent, pre-op evaluation, timeout performed, IV checked, risks and benefits discussed and monitors and equipment checked  Epidural Patient position: sitting Prep: DuraPrep Patient monitoring: heart rate, continuous pulse ox and blood pressure Location: L3-L4 Injection technique: LOR air  Needle:  Needle type: Tuohy  Needle gauge: 18 G Needle length: 9 cm and 9 Needle insertion depth: 9 cm Catheter type: closed end flexible Catheter size: 20 Guage Catheter at skin depth: 14 and 16 cm Test dose: 2% lidocaine with Epi 1:200 K and negative  Assessment Events: blood not aspirated, injection not painful, no injection resistance, negative IV test and no paresthesia  Additional Notes Patient identified. Risks/Benefits/Options discussed with patient including but not limited to bleeding, infection, nerve damage, paralysis, failed block, incomplete pain control, headache, blood pressure changes, nausea, vomiting, reactions to medication both or allergic, itching and postpartum back pain. Confirmed with bedside nurse the patient's most recent platelet count. Confirmed with patient that they are not currently taking any anticoagulation, have any bleeding history or any family history of bleeding disorders. Patient expressed understanding and wished to proceed. All questions were answered. Sterile technique was used throughout the entire procedure. Please see nursing notes for vital signs. Test dose was given through epidural needle and negative prior to continuing to dose epidural or start infusion.   Patient was given instructions on fall risk and not to get out of bed. All questions and concerns addressed with instructions to call with any issues.

## 2017-06-12 NOTE — Anesthesia Preprocedure Evaluation (Signed)
Anesthesia Evaluation  Patient identified by MRN, date of birth, ID band Patient awake    History of Anesthesia Complications Negative for: history of anesthetic complications  Airway Mallampati: II  TM Distance: >3 FB Neck ROM: Full    Dental no notable dental hx.    Pulmonary neg pulmonary ROS,    Pulmonary exam normal        Cardiovascular negative cardio ROS Normal cardiovascular exam Rate:Normal     Neuro/Psych Anxiety negative neurological ROS     GI/Hepatic negative GI ROS, Neg liver ROS,   Endo/Other  negative endocrine ROS  Renal/GU negative Renal ROS  negative genitourinary   Musculoskeletal negative musculoskeletal ROS (+)   Abdominal (+) + obese,   Peds negative pediatric ROS (+)  Hematology negative hematology ROS (+)   Anesthesia Other Findings   Reproductive/Obstetrics (+) Pregnancy                             Anesthesia Physical Anesthesia Plan  ASA: II  Anesthesia Plan: Epidural   Post-op Pain Management:    Induction:   PONV Risk Score and Plan:   Airway Management Planned:   Additional Equipment:   Intra-op Plan:   Post-operative Plan:   Informed Consent:   Plan Discussed with:   Anesthesia Plan Comments:         Anesthesia Quick Evaluation

## 2017-06-12 NOTE — Progress Notes (Signed)
Labor Progress Note  Destiny Joseph is a 36 y.o. [id number] at 2276w0d  admitted for induction of labor due to Elective at term for macrosomia.  S: Patient comfortable with epidural. Not feeling contractions or pressure.  O:  BP 117/68 (BP Location: Right Arm)   Pulse 83   Temp 98.1 F (36.7 C) (Oral)   Resp 15   Ht 5\' 9"  (1.753 m)   Wt 289 lb (131.1 kg)   LMP 09/12/2016   BMI 42.68 kg/m   No intake/output data recorded.  FHT:  FHR: 135 bpm, variability: moderate,  accelerations:  Abscent,  decelerations:  Present variable UC:   regular, every 1-2 minutes SVE:   Dilation: 10 Effacement (%): 100 Station: +1 Exam by:: S Earl RNC SROM  Labs: Lab Results  Component Value Date   WBC 9.3 06/12/2017   HGB 10.6 (L) 06/12/2017   HCT 33.6 (L) 06/12/2017   MCV 78.3 06/12/2017   PLT 268 06/12/2017    Assessment / Plan: 36 y.o. [id number] 8776w0d in active labor Induction of labor due to macrosomia,  progressing well on pitocin  Labor: Progressing normally, pitocin cut off due to category 2 strip Fetal Wellbeing:  Category II Pain Control:  Epidural Anticipated MOD:  NSVD  Expectant management   Caryl AdaJazma Phelps, DO OB Fellow Faculty Practice, [facility] 06/12/2017, 4:05 PM

## 2017-06-13 ENCOUNTER — Inpatient Hospital Stay (HOSPITAL_COMMUNITY): Payer: 59 | Admitting: Anesthesiology

## 2017-06-13 ENCOUNTER — Encounter (HOSPITAL_COMMUNITY)
Admission: AD | Disposition: A | Discharge status: Home / Self Care | Payer: Self-pay | Source: Ambulatory Visit | Attending: Obstetrics & Gynecology

## 2017-06-13 DIAGNOSIS — Z302 Encounter for sterilization: Secondary | ICD-10-CM

## 2017-06-13 HISTORY — PX: TUBAL LIGATION: SHX77

## 2017-06-13 SURGERY — LIGATION, FALLOPIAN TUBE, POSTPARTUM
Anesthesia: Epidural | Site: Abdomen | Laterality: Bilateral

## 2017-06-13 SURGERY — LIGATION, FALLOPIAN TUBE, POSTPARTUM
Anesthesia: Epidural | Laterality: Bilateral

## 2017-06-13 MED ORDER — KETOROLAC TROMETHAMINE 30 MG/ML IJ SOLN
INTRAMUSCULAR | Status: AC
  Filled 2017-06-13: qty 1

## 2017-06-13 MED ORDER — METOCLOPRAMIDE HCL 10 MG PO TABS
10.0000 mg | ORAL_TABLET | Freq: Once | ORAL | Status: AC
  Administered 2017-06-13: 10 mg via ORAL
  Filled 2017-06-13: qty 1

## 2017-06-13 MED ORDER — ONDANSETRON HCL 4 MG/2ML IJ SOLN
INTRAMUSCULAR | Status: AC
  Filled 2017-06-13: qty 2

## 2017-06-13 MED ORDER — SODIUM BICARBONATE 8.4 % IV SOLN
INTRAVENOUS | Status: DC | PRN
  Administered 2017-06-13 (×4): 5 mL via EPIDURAL

## 2017-06-13 MED ORDER — SODIUM BICARBONATE 8.4 % IV SOLN
INTRAVENOUS | Status: AC
  Filled 2017-06-13: qty 50

## 2017-06-13 MED ORDER — MIDAZOLAM HCL 2 MG/2ML IJ SOLN
INTRAMUSCULAR | Status: DC | PRN
  Administered 2017-06-13: 2 mg via INTRAVENOUS

## 2017-06-13 MED ORDER — FAMOTIDINE 20 MG PO TABS
40.0000 mg | ORAL_TABLET | Freq: Once | ORAL | Status: AC
  Administered 2017-06-13: 40 mg via ORAL
  Filled 2017-06-13: qty 2

## 2017-06-13 MED ORDER — LACTATED RINGERS IV SOLN
INTRAVENOUS | Status: DC | PRN
  Administered 2017-06-13: 09:00:00 via INTRAVENOUS

## 2017-06-13 MED ORDER — METOCLOPRAMIDE HCL 10 MG PO TABS: 10.0000 mg | ORAL_TABLET | Freq: Once | ORAL | Status: DC

## 2017-06-13 MED ORDER — LIDOCAINE-EPINEPHRINE (PF) 2 %-1:200000 IJ SOLN
INTRAMUSCULAR | Status: AC
  Filled 2017-06-13: qty 20

## 2017-06-13 MED ORDER — ONDANSETRON HCL 4 MG/2ML IJ SOLN
INTRAMUSCULAR | Status: DC | PRN
  Administered 2017-06-13 (×2): 4 mg via INTRAVENOUS

## 2017-06-13 MED ORDER — METOCLOPRAMIDE HCL 5 MG/ML IJ SOLN: 10.0000 mg | Freq: Once | INTRAMUSCULAR | Status: DC | PRN

## 2017-06-13 MED ORDER — FENTANYL CITRATE (PF) 100 MCG/2ML IJ SOLN
INTRAMUSCULAR | Status: DC | PRN
  Administered 2017-06-13: 50 ug via INTRAVENOUS

## 2017-06-13 MED ORDER — LACTATED RINGERS IV SOLN
INTRAVENOUS | Status: DC
  Administered 2017-06-13: 20 mL/h via INTRAVENOUS

## 2017-06-13 MED ORDER — FAMOTIDINE 20 MG PO TABS: 40.0000 mg | ORAL_TABLET | Freq: Once | ORAL | Status: DC

## 2017-06-13 MED ORDER — MEPERIDINE HCL 25 MG/ML IJ SOLN: 6.2500 mg | INTRAMUSCULAR | Status: DC | PRN

## 2017-06-13 MED ORDER — HYDROMORPHONE HCL 1 MG/ML IJ SOLN
0.2500 mg | INTRAMUSCULAR | Status: DC | PRN
  Administered 2017-06-13 (×2): 0.5 mg via INTRAVENOUS

## 2017-06-13 MED ORDER — FENTANYL CITRATE (PF) 100 MCG/2ML IJ SOLN
INTRAMUSCULAR | Status: AC
  Filled 2017-06-13: qty 2

## 2017-06-13 MED ORDER — MIDAZOLAM HCL 2 MG/2ML IJ SOLN
INTRAMUSCULAR | Status: AC
  Filled 2017-06-13: qty 2

## 2017-06-13 MED ORDER — HYDROMORPHONE HCL 1 MG/ML IJ SOLN
INTRAMUSCULAR | Status: AC
  Administered 2017-06-13: 0.5 mg via INTRAVENOUS
  Filled 2017-06-13: qty 1

## 2017-06-13 MED ORDER — BUPIVACAINE HCL 0.5 % IJ SOLN
INTRAMUSCULAR | Status: DC | PRN
  Administered 2017-06-13 (×2): 5 mL

## 2017-06-13 MED ORDER — KETOROLAC TROMETHAMINE 30 MG/ML IJ SOLN
INTRAMUSCULAR | Status: DC | PRN
  Administered 2017-06-13: 30 mg via INTRAVENOUS

## 2017-06-13 SURGICAL SUPPLY — 32 items
ADH SKN CLS APL DERMABOND .7 (GAUZE/BANDAGES/DRESSINGS) ×1
CLIP FILSHIE TUBAL LIGA STRL (Clip) ×6 IMPLANT
CLOTH BEACON ORANGE TIMEOUT ST (SAFETY) ×3 IMPLANT
CONTAINER PREFILL 10% NBF 15ML (MISCELLANEOUS) ×1 IMPLANT
DERMABOND ADVANCED (GAUZE/BANDAGES/DRESSINGS) ×2
DERMABOND ADVANCED .7 DNX12 (GAUZE/BANDAGES/DRESSINGS) ×1 IMPLANT
DRSG OPSITE POSTOP 3X4 (GAUZE/BANDAGES/DRESSINGS) ×3 IMPLANT
DURAPREP 26ML APPLICATOR (WOUND CARE) ×3 IMPLANT
ELECT REM PT RETURN 9FT ADLT (ELECTROSURGICAL)
ELECTRODE REM PT RTRN 9FT ADLT (ELECTROSURGICAL) IMPLANT
GLOVE BIOGEL PI IND STRL 7.0 (GLOVE) ×1 IMPLANT
GLOVE BIOGEL PI IND STRL 7.5 (GLOVE) ×1 IMPLANT
GLOVE BIOGEL PI INDICATOR 7.0 (GLOVE) ×2
GLOVE BIOGEL PI INDICATOR 7.5 (GLOVE) ×2
GLOVE SURG SS PI 7.0 STRL IVOR (GLOVE) ×3 IMPLANT
GOWN STRL REUS W/TWL LRG LVL3 (GOWN DISPOSABLE) ×6 IMPLANT
NEEDLE HYPO 22GX1.5 SAFETY (NEEDLE) ×3 IMPLANT
NS IRRIG 1000ML POUR BTL (IV SOLUTION) ×3 IMPLANT
PACK ABDOMINAL MINOR (CUSTOM PROCEDURE TRAY) ×3 IMPLANT
PENCIL BUTTON HOLSTER BLD 10FT (ELECTRODE) IMPLANT
PROTECTOR NERVE ULNAR (MISCELLANEOUS) ×3 IMPLANT
SPONGE LAP 18X18 X RAY DECT (DISPOSABLE) ×2 IMPLANT
SPONGE LAP 4X18 X RAY DECT (DISPOSABLE) ×3 IMPLANT
SUT MNCRL AB 4-0 PS2 18 (SUTURE) ×3 IMPLANT
SUT PLAIN 2 0 (SUTURE)
SUT PLAIN 2 0 XLH (SUTURE) ×3 IMPLANT
SUT PLAIN ABS 2-0 CT1 27XMFL (SUTURE) IMPLANT
SUT VICRYL 0 TIES 12 18 (SUTURE) IMPLANT
SUT VICRYL 0 UR6 27IN ABS (SUTURE) ×3 IMPLANT
SYR CONTROL 10ML LL (SYRINGE) ×3 IMPLANT
TOWEL OR 17X24 6PK STRL BLUE (TOWEL DISPOSABLE) ×6 IMPLANT
TRAY FOLEY CATH SILVER 14FR (SET/KITS/TRAYS/PACK) ×3 IMPLANT

## 2017-06-13 NOTE — Addendum Note (Signed)
Addendum  created 06/13/17 0846 by Okey RegalFlowers, Archie Atilano W Jr., CRNA   Charge Capture section accepted, Sign clinical note

## 2017-06-13 NOTE — Progress Notes (Signed)
OB Note  Patient seen and examined: obese, soft, fundus approx 4-6cm below the umbilicus. D/w pt re: BTL, including permanency, risk of regret and surgical risks. PMHx significant for BMI 40s, AMA and PPD#1. Pt would like to proceed with surgery. BTL papers UTD and confirms NPO. Pt consented for mini-lap and BTL and will do via filshie clips  Can proceed when OR is ready  Destiny Joseph, Jr MD Attending Center for [facility] (Faculty Practice) 06/13/2017 Time: [phone]

## 2017-06-13 NOTE — Progress Notes (Signed)
Contacted by Patients nurse in r/t request for bilateral tubal.  Upon review of notes, it was noted on 06/12/2017 by Caryl AdaJazma Phelps that pt has requested a BTL for contraception.  Review of upcoming surgeries thru 06/16/2017 shows no scheduled time for BTL for patient.  Spoke with Dr. Debroah LoopArnold and made him aware of pt's request for PTL, pt still has epidural and pt is currently NPO with no scheduled surgery date or time.

## 2017-06-13 NOTE — Anesthesia Preprocedure Evaluation (Signed)
Anesthesia Evaluation  Patient identified by MRN, date of birth, ID band Patient awake    History of Anesthesia Complications Negative for: history of anesthetic complications  Airway Mallampati: II  TM Distance: >3 FB Neck ROM: Full    Dental no notable dental hx. (+) Teeth Intact   Pulmonary neg pulmonary ROS,    Pulmonary exam normal breath sounds clear to auscultation       Cardiovascular negative cardio ROS Normal cardiovascular exam+ Valvular Problems/Murmurs  Rhythm:Regular Rate:Normal     Neuro/Psych Anxiety negative neurological ROS     GI/Hepatic negative GI ROS, Neg liver ROS,   Endo/Other  negative endocrine ROS  Renal/GU negative Renal ROS  negative genitourinary   Musculoskeletal negative musculoskeletal ROS (+)   Abdominal (+) + obese,   Peds negative pediatric ROS (+)  Hematology negative hematology ROS (+)   Anesthesia Other Findings   Reproductive/Obstetrics Desires Post partum tubal ligation                             Anesthesia Physical  Anesthesia Plan  ASA: II  Anesthesia Plan: Epidural   Post-op Pain Management:    Induction:   PONV Risk Score and Plan: 3 and Ondansetron, Dexamethasone, Midazolam and Propofol infusion  Airway Management Planned: Natural Airway and Nasal Cannula  Additional Equipment:   Intra-op Plan:   Post-operative Plan:   Informed Consent: I have reviewed the patients History and Physical, chart, labs and discussed the procedure including the risks, benefits and alternatives for the proposed anesthesia with the patient or authorized representative who has indicated his/her understanding and acceptance.   Dental advisory given  Plan Discussed with: CRNA, Anesthesiologist and Surgeon  Anesthesia Plan Comments:         Anesthesia Quick Evaluation

## 2017-06-13 NOTE — Anesthesia Postprocedure Evaluation (Signed)
Anesthesia Post Note  Patient: Kalkidan N Randol  Procedure(s) Performed: Procedure(s) (LRB): POST PARTUM TUBAL LIGATION (Bilateral)     Patient location during evaluation: PACU Anesthesia Type: Epidural Level of consciousness: awake and alert and oriented Pain management: pain level controlled Vital Signs Assessment: post-procedure vital signs reviewed and stable Respiratory status: spontaneous breathing, nonlabored ventilation and respiratory function stable Cardiovascular status: stable and blood pressure returned to baseline Postop Assessment: no headache, no backache, epidural receding, patient able to bend at knees and no signs of nausea or vomiting Anesthetic complications: no    Last Vitals:  Vitals:   06/13/17 1130 06/13/17 1139  BP: 102/67   Pulse: (!) 48 (!) 58  Resp: 18 11  Temp:    SpO2: 100% 100%    Last Pain:  Vitals:   06/13/17 1139  TempSrc:   PainSc: 4    Pain Goal: Patients Stated Pain Goal: 0 (06/12/17 0701)               Aundria Bitterman A.

## 2017-06-13 NOTE — Progress Notes (Signed)
POSTPARTUM PROGRESS NOTE  Post Partum Day 1  Subjective:  Destiny Joseph is a 36 y.o. [id number] s/p NSVD at 1665w0d.  No acute events overnight.  Pt denies problems with ambulating, voiding or po intake.  NPO since midnight for BTL today. Lochia Moderate.   Objective: Blood pressure 113/67, pulse 69, temperature 98.5 F (36.9 C), temperature source Oral, resp. rate 16, height 5\' 9"  (1.753 m), weight 289 lb (131.1 kg), last menstrual period 09/12/2016, SpO2 99 %, unknown if currently breastfeeding.  Physical Exam:  General: alert, cooperative and no distress Chest: no respiratory distress Heart:regular rate, distal pulses intact Abdomen: soft, nontender,  Uterine Fundus: firm, appropriately tender DVT Evaluation: No calf swelling or tenderness Extremities: no edema Skin: warm, dry   Recent Labs  06/12/17 0730  HGB 10.6*  HCT 33.6*    Assessment/Plan: Destiny Joseph is a 36 y.o. [id number] s/p NSVD at 6265w0d   PPD#1 - Doing well Contraception: BTL schedule for 0930 today Feeding: bottle Dispo: Plan for discharge tomorrow.   LOS: 1 day   Kandra NicolasJulie P DegeleMD 06/13/2017, 8:30 AM

## 2017-06-13 NOTE — Transfer of Care (Signed)
Immediate Anesthesia Transfer of Care Note  Patient: Destiny Joseph  Procedure(s) Performed: Procedure(s): POST PARTUM TUBAL LIGATION (Bilateral)  Patient Location: PACU  Anesthesia Type:Epidural  Level of Consciousness: awake and alert   Airway & Oxygen Therapy: Patient Spontanous Breathing  Post-op Assessment: Report given to RN and Post -op Vital signs reviewed and stable  Post vital signs: Reviewed  Last Vitals:  Vitals:   06/12/17 2352 06/13/17 0758  BP: 126/63 113/67  Pulse: 99 69  Resp: 14 16  Temp: 37.1 C 36.9 C  SpO2:  99%    Last Pain:  Vitals:   06/13/17 0758  TempSrc: Oral  PainSc: 0-No pain      Patients Stated Pain Goal: 0 (06/12/17 0701)  Complications: No apparent anesthesia complications

## 2017-06-13 NOTE — Progress Notes (Signed)
Told in report that pt's is getting a BTL on 9/9. Called house coverage to check time for surgery. At this time no time is set for pt's surgery and no consent has been signed. Placed pt on NPO and gathered supplies for surgery.

## 2017-06-13 NOTE — Anesthesia Postprocedure Evaluation (Signed)
Anesthesia Post Note  Patient: Destiny Joseph  Procedure(s) Performed: * No procedures listed *     Patient location during evaluation: Mother Baby Anesthesia Type: Epidural Level of consciousness: awake and alert and oriented Pain management: pain level controlled Vital Signs Assessment: post-procedure vital signs reviewed and stable Respiratory status: nonlabored ventilation and spontaneous breathing Cardiovascular status: blood pressure returned to baseline Postop Assessment: no headache and epidural receding Anesthetic complications: no    Last Vitals:  Vitals:   06/12/17 1919 06/12/17 2352  BP: 126/71 126/63  Pulse: 76 99  Resp: 16 14  Temp: 37.4 C 37.1 C  SpO2: 97%     Last Pain:  Vitals:   06/12/17 2352  TempSrc: Oral  PainSc:    Pain Goal: Patients Stated Pain Goal: 0 (06/12/17 0701)               Gwynne EdingerPaul C Destiny Hagin

## 2017-06-13 NOTE — Anesthesia Postprocedure Evaluation (Signed)
Anesthesia Post Note  Patient: Destiny Joseph  Procedure(s) Performed: Procedure(s) (LRB): POST PARTUM TUBAL LIGATION (Bilateral)     Patient location during evaluation: Mother Baby Anesthesia Type: Epidural Level of consciousness: awake and alert, oriented and patient cooperative Pain management: pain level controlled Vital Signs Assessment: post-procedure vital signs reviewed and stable Respiratory status: spontaneous breathing Cardiovascular status: stable Postop Assessment: no headache, epidural receding, patient able to bend at knees and no signs of nausea or vomiting Anesthetic complications: no Comments: Pain score 3.      Last Vitals:  Vitals:   06/13/17 1300 06/13/17 1400  BP: 113/70 (!) 111/57  Pulse: (!) 55 74  Resp: 18 18  Temp: 36.6 C 36.8 C  SpO2: 99% 99%    Last Pain:  Vitals:   06/13/17 1400  TempSrc: Oral  PainSc: 3    Pain Goal: Patients Stated Pain Goal: 0 (06/13/17 1400)               Merrilyn PumaWRINKLE,Shye Doty

## 2017-06-13 NOTE — Op Note (Signed)
Operative Note   06/13/2017  PRE-OP DIAGNOSIS: Desire for permanent sterilization.  Postpartum Day #1 BMI 40s Advanced maternal age   POST-OP DIAGNOSIS: Same  SURGEON: Surgeon(s) and Role:    * [place] Bingharlie Gunter Conde, MD - Primary  ASSISTANT: None  ANESTHESIA: epidural and local  PROCEDURE: mini-laparotomy, bilateral tubal ligation via Filshie Clips method  ESTIMATED BLOOD LOSS: 5mL  DRAINS: per anesthesia note  TOTAL IV FLUIDS: per anesthesia note  SPECIMENS:  None  VTE PROPHYLAXIS: SCDs to the bilateral lower extremities  ANTIBIOTICS: not indicated  COMPLICATIONS: none  DISPOSITION: PACU - hemodynamically stable.  CONDITION: stable  FINDINGS: No intra-abdominal adhesions noted. Smooth, normally contoured uterine fundus and bilateral tubes. Normal appearing tubes and normal ovaries on palpation.  PROCEDURE IN DETAIL: The patient was taken to the OR where anesthesia was administed. The patient was positioned in dorsal supine. The patient was prepped and draped in the normal sterile fashion a 4cm horizontal incision was made approximately 2 finger breadths below the umbilicus, after injection with local anesthesia. The skin was then incised with the scalpel and the underlying tissue dissected with the bovie and the fascia nicked in the midline with the scalpel and then extended laterally sharply.  The abdomen was then entered bluntly and a moist lap sponge used to displace the bowel.   The left Fallopian tube was identified by tracing out to the fimbraie, grasped with the Babcock clamps. An avascular midsection of the tube approximately 3-4cm from the cornua was grasped with the babcock clamps and the filshie clip was applied, taking care to incorporate the entire tube.  Attention was then turned to the right fallopian tube after confirmation by tracing the tube out to the fimbriae. The same procedure was then performed on the right Fallopian tube, with excellent hemostasis was noted  from both BTL sites.   The lap sponge was then removed from the abdomen and the fascia closed in running fashion with 0 vicryl and the subcutaneous tissue irrigated and closed with interrupted sutures of 3-0 plain gut. The skin was then closed with 4-0 monocryl and dermabond .   The patient tolerated the procedure well. All counts were correct x 2. The patient was transferred to the recovery room awake, alert and breathing independently.   Cornelia Copaharlie Bellamy Rubey, Jr MD Attending Center for [facility] Mid[facility](Faculty Practice)

## 2017-06-13 NOTE — Addendum Note (Signed)
Addendum  created 06/13/17 1625 by Angela AdamWrinkle, Mikhia Dusek G, CRNA   Sign clinical note

## 2017-06-13 NOTE — Anesthesia Postprocedure Evaluation (Signed)
Anesthesia Post Note  Patient: Destiny Joseph  Procedure(s) Performed: * No procedures listed *     Patient location during evaluation: Mother Baby Anesthesia Type: Epidural Level of consciousness: awake and alert Pain management: pain level controlled Vital Signs Assessment: post-procedure vital signs reviewed and stable Respiratory status: spontaneous breathing, nonlabored ventilation and respiratory function stable Cardiovascular status: stable Postop Assessment: no headache, no backache and epidural receding Anesthetic complications: no    Last Vitals:  Vitals:   06/12/17 2352 06/13/17 0758  BP: 126/63 113/67  Pulse: 99 69  Resp: 14 16  Temp: 37.1 C 36.9 C  SpO2:  99%    Last Pain:  Vitals:   06/13/17 0758  TempSrc: Oral  PainSc: 0-No pain   Pain Goal: Patients Stated Pain Goal: 0 (06/12/17 0701)               Adelfa Kohichard W Sheral FlowFlowers Jr

## 2017-06-14 ENCOUNTER — Encounter (HOSPITAL_COMMUNITY): Payer: Self-pay | Admitting: Obstetrics and Gynecology

## 2017-06-14 DIAGNOSIS — Z9851 Tubal ligation status: Secondary | ICD-10-CM

## 2017-06-14 MED ORDER — IBUPROFEN 600 MG PO TABS: 600.0000 mg | ORAL_TABLET | Freq: Four times a day (QID) | ORAL | 0 refills | Status: DC

## 2017-06-14 MED ORDER — PRENATAL MULTIVITAMIN CH: 1.0000 | ORAL_TABLET | Freq: Every day | ORAL | 2 refills | Status: DC

## 2017-06-14 MED ORDER — SENNOSIDES-DOCUSATE SODIUM 8.6-50 MG PO TABS: 1.0000 | ORAL_TABLET | Freq: Every evening | ORAL | 0 refills | Status: DC | PRN

## 2017-06-14 NOTE — Discharge Summary (Signed)
OB Discharge Summary     Patient Name: Destiny Joseph DOB: [birth year] MRN: [id number]  Date of admission: 06/12/2017 Delivering MD: Pincus  Large   Date of discharge: 06/14/2017  Admitting diagnosis: 36w,Water Broke and Contractions  desires sterilization Desires Sterilization Intrauterine pregnancy: [redacted]w[redacted]d     Secondary diagnosis:  Active Problems:   Macrosomia affecting management of mother in third trimester   SVD (spontaneous vaginal delivery)   Status post tubal ligation  Additional problems:  Patient Active Problem List   Diagnosis Date Noted  . SVD (spontaneous vaginal delivery) 06/14/2017  . Status post tubal ligation 06/14/2017  . Preterm labor 05/23/2017  . Suspected polyhydramnios not found 05/21/2017  . Macrosomia affecting management of mother in third trimester 05/12/2017  . Palpitations 05/06/2017  . Murmur, cardiac 05/06/2017  . Group B streptococcus urinary tract infection affecting pregnancy 02/21/2017  . Pap smear of cervix with ASCUS, cannot exclude HGSIL 01/11/2017  . 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 Rubella non-immune status, antepartum 11/20/2016  . Obesity in pregnancy 11/19/2016  . Supervision of normal pregnancy, antepartum 11/16/2016  . AMA 11/16/2016  . History of abnormal cervical Pap smear 11/16/2016     Discharge diagnosis: Term Pregnancy Delivered                                                                                                Post partum procedures:postpartum tubal ligation  Augmentation: Pitocin  Complications: None  Hospital course:  Induction of Labor With Vaginal Delivery   36 y.o. yo [phone] at [redacted]w[redacted]d was admitted to the hospital 06/12/2017 for induction of labor.  Indication for induction: macrosomia at term.  Patient had an uncomplicated labor course as follows: Membrane Rupture Time/Date: 5:40 AM ,06/12/2017   Intrapartum Procedures: Episiotomy: None [1]                                         Lacerations:   None [1]  Patient had delivery of a Viable infant.  Information for the patient's newborn:  Matisyn, Cabeza Girl Mykah [[id number]]  Delivery Method: Vaginal, Spontaneous Delivery (Filed from Delivery Summary)   06/12/2017  Details of delivery can be found in separate delivery note.  Patient had a routine postpartum course. Patient is discharged home 06/14/17.  Physical exam  Vitals:   06/13/17 1732 06/13/17 2105 06/14/17 0124 06/14/17 0547  BP: (!) 107/58 127/65 111/70 115/62  Pulse: 64 68 60 60  Resp: Temp: 98.3 F (36.8 C) 98 F (36.7 C) 98.2 F (36.8 C) 97.9 F (36.6 C)  TempSrc: Oral Oral Oral Oral  SpO2: 99%     Weight:      Height:       General: alert, cooperative and no distress Lochia: appropriate Uterine Fundus: firm Incision: Healing well with no significant drainage, No significant erythema, Dressing is clean, dry, and intact DVT Evaluation: No evidence of DVT seen on physical exam. No significant calf/ankle edema. Labs: Lab Results  Component Value  Date   WBC 9.3 06/12/2017   HGB 10.6 (L) 06/12/2017   HCT 33.6 (L) 06/12/2017   MCV 78.3 06/12/2017   PLT 268 06/12/2017   CMP Latest Ref Rng & Units 12/15/2016  Glucose 65 - 99 mg/dL 161(W160(H)  BUN 6 - 20 mg/dL 14  Creatinine 9.600.44 - 4.541.00 mg/dL 0.980.80  Sodium 119135 - 147145 mmol/L 134(L)  Potassium 3.5 - 5.1 mmol/L 4.3  Chloride 101 - 111 mmol/L 106  CO2 22 - 32 mmol/L 14(L)  Calcium 8.9 - 10.3 mg/dL 8.3(L)  Total Protein 6.5 - 8.1 g/dL 6.9  Total Bilirubin 0.3 - 1.2 mg/dL 1.2  Alkaline Phos 38 - 126 U/L 89  AST 15 - 41 U/L 44(H)  ALT 14 - 54 U/L 28    Discharge instruction: per After Visit Summary and "Baby and Me Booklet".  After visit meds:  Allergies as of 06/14/2017   No Known Allergies     Medication List    TAKE these medications   AFRIN SINUS NA Place 1-2 sprays into the nose daily.   calcium carbonate 500 MG chewable tablet Commonly known as:  TUMS - dosed in mg elemental calcium Chew  2 tablets by mouth 3 (three) times daily as needed for indigestion or heartburn.   fluticasone 50 MCG/ACT nasal spray Commonly known as:  FLONASE Place 1 spray into both nostrils 2 (two) times daily.   ibuprofen 600 MG tablet Commonly known as:  ADVIL,MOTRIN Take 1 tablet (600 mg total) by mouth every 6 (six) hours.   loratadine 10 MG tablet Commonly known as:  CLARITIN Take 1 tablet (10 mg total) by mouth daily.            Discharge Care Instructions        Start     Ordered   06/14/17 0000  ibuprofen (ADVIL,MOTRIN) 600 MG tablet  Every 6 hours     06/14/17 0741      Diet: routine diet  Activity: Advance as tolerated. Pelvic rest for 6 weeks.   Outpatient follow up: 4-6 weeks Follow up Appt:No future appointments. Follow up Visit:  Follow-up Information    [facility] [place]. Call.   Specialty:  Obstetrics and Gynecology Why:  Call in a week to schedule a postpartum follow up visit in 4-6 weeks. Contact information: [address], [address] [place] [phone]         Postpartum contraception: BTL  Newborn Data: Live born female  Birth Weight: 9 lb 4.3 oz (4205 g) APGAR: 8, 9  Baby Feeding: Bottle Disposition:home with mother   06/14/2017 Anna Genrelay Carter, Student-PA   [facility] FELLOW [facility]CHARGE [facility]  I have seen and examined this patient. I agree with above documentation and have made edits as needed.   Caryl AdaJazma Dayson Aboud, DO OB Fellow 9:08 AM

## 2017-06-14 NOTE — Discharge Instructions (Signed)

## 2017-06-16 ENCOUNTER — Encounter (HOSPITAL_COMMUNITY): Payer: Self-pay

## 2017-06-17 ENCOUNTER — Encounter: Payer: Self-pay | Admitting: Obstetrics & Gynecology

## 2017-06-18 ENCOUNTER — Telehealth: Payer: Self-pay | Admitting: Cardiology

## 2017-06-18 NOTE — Telephone Encounter (Signed)
Called patient to reschedule monitor and patient advised she doesn't want the monitor she had her baby and hasn't had anymore problems

## 2017-06-22 ENCOUNTER — Encounter (HOSPITAL_COMMUNITY): Payer: Self-pay | Admitting: *Deleted

## 2017-07-14 ENCOUNTER — Ambulatory Visit (INDEPENDENT_AMBULATORY_CARE_PROVIDER_SITE_OTHER): Payer: 59 | Admitting: Obstetrics and Gynecology

## 2017-07-14 ENCOUNTER — Encounter: Payer: Self-pay | Admitting: Obstetrics and Gynecology

## 2017-07-14 VITALS — BP 94/61 | HR 83 | Wt 266.0 lb

## 2017-07-14 DIAGNOSIS — Z1389 Encounter for screening for other disorder: Secondary | ICD-10-CM

## 2017-07-14 DIAGNOSIS — Z23 Encounter for immunization: Secondary | ICD-10-CM

## 2017-07-14 DIAGNOSIS — F53 Postpartum depression: Secondary | ICD-10-CM

## 2017-07-14 DIAGNOSIS — O99345 Other mental disorders complicating the puerperium: Principal | ICD-10-CM

## 2017-07-14 MED ORDER — SERTRALINE HCL 50 MG PO TABS: 50.0000 mg | ORAL_TABLET | Freq: Every day | ORAL | 3 refills | Status: DC

## 2017-07-14 NOTE — Addendum Note (Signed)
Addended by: Kathie Dike on: 07/14/2017 10:36 AM   Modules accepted: Orders

## 2017-07-14 NOTE — Progress Notes (Signed)
Post Partum Exam  Destiny Joseph is a 36 y.o. [phone] female who presents for a postpartum visit. She is 4 weeks postpartum following a spontaneous vaginal delivery. I have fully reviewed the prenatal and intrapartum course. The delivery was at 39 gestational weeks.  Anesthesia: epidural. Postpartum course has been eventful because of post partum depression symptoms and excess stress. Baby's course has been eventful due to dealing with Colic. Baby is feeding by bottle - Similac Sensitive RS. Bleeding no bleeding. Bowel function is experiencing mild constipation. Bladder function is normal. Patient is not sexually active. Contraception method is tubal ligation. Postpartum depression screening:positive (score 18).  The following portions of the patient's history were reviewed and updated as appropriate: allergies, current medications, past family history, past medical history, past social history, past surgical history and problem list.  Review of Systems Pertinent items are noted in HPI.    Objective:  unknown if currently breastfeeding.  General:  alert, cooperative and no distress   Breasts:  inspection negative, no nipple discharge or bleeding, no masses or nodularity palpable  Lungs: clear to auscultation bilaterally  Heart:  regular rate and rhythm  Abdomen: soft, non-tender; bowel sounds normal; no masses,  no organomegaly   Vulva:  normal  Vagina: normal vagina, no discharge, exudate, lesion, or erythema  Cervix:  multiparous appearance  Corpus: normal size, contour, position, consistency, mobility, non-tender  Adnexa:  normal adnexa and no mass, fullness, tenderness  Rectal Exam: Not performed.        Assessment:    Normal postpartum exam. Pap smear not done at today's visit.   Plan:   1. Contraception: tubal ligation 2. Referral to behavioral health made and Rx zoloft provided for postpartum depression Patient to follow up for LEEP procedure in the next 2-4 weeks 3.  Follow up in: 4 weeks or as needed.

## 2017-08-11 ENCOUNTER — Encounter: Payer: Self-pay | Admitting: *Deleted

## 2017-08-11 ENCOUNTER — Ambulatory Visit (INDEPENDENT_AMBULATORY_CARE_PROVIDER_SITE_OTHER): Payer: 59 | Admitting: Obstetrics & Gynecology

## 2017-08-11 ENCOUNTER — Encounter: Payer: Self-pay | Admitting: Obstetrics & Gynecology

## 2017-08-11 VITALS — BP 107/66 | HR 57 | Resp 16 | Ht 69.0 in | Wt 266.0 lb

## 2017-08-11 DIAGNOSIS — R87612 Low grade squamous intraepithelial lesion on cytologic smear of cervix (LGSIL): Secondary | ICD-10-CM

## 2017-08-11 DIAGNOSIS — Z3202 Encounter for pregnancy test, result negative: Secondary | ICD-10-CM

## 2017-08-11 DIAGNOSIS — N871 Moderate cervical dysplasia: Secondary | ICD-10-CM

## 2017-08-11 DIAGNOSIS — Z01812 Encounter for preprocedural laboratory examination: Secondary | ICD-10-CM

## 2017-08-11 LAB — POCT URINE PREGNANCY: PREG TEST UR: NEGATIVE

## 2017-08-11 NOTE — Progress Notes (Signed)
LEEP PROCEDURE NOTE Pap smear and colposcopy reviewed.   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  Role of HPV,cervical dysplasia and need for close followup was empasized. Informed written consent was obtained. All questions were answered. Time out performed.  Procedure: The patient was placed in lithotomy position and the bivalved coated speculum was placed in the patient's vagina. A grounding pad placed on the patient. Lugol's solution was applied to the cervix and areas of decreased uptake were noted 6 o'clock.  Colposcopy perfomred.   Local anesthesia was administered via an intracervical block using 10cc of 2% Lidocaine with epinephrine. The suction was turned on and the wide shallow Fisher [facility] Biopsy Excisor on 50 Watts of cutting current was used to excise the area of decreased uptake and excise the entire transformation zone. Excellent hemostasis was achieved using roller ball coagulation set at 50 Watts coagulation current. Monsel's solution was then applied and the speculum was removed from the vagina. Specimens were sent to pathology. The patient tolerated the procedure well. Post-operative instructions given to patient, including instruction to seek medical attention for persistent bright red bleeding, fever, abdominal/pelvic pain, dysuria, nausea or vomiting. She was also told about the possibility of having copious yellow to black tinged discharge. She was counseled to avoid anything in the vagina (sex/douching/tampons) for 4 weeks. She has a  3 week post-operative check to review results and assess wound healing and discuss pathology.

## 2017-08-13 ENCOUNTER — Encounter: Payer: Self-pay | Admitting: Obstetrics & Gynecology

## 2017-08-16 ENCOUNTER — Telehealth: Payer: Self-pay | Admitting: *Deleted

## 2017-08-16 ENCOUNTER — Ambulatory Visit (HOSPITAL_COMMUNITY): Payer: 59 | Admitting: Psychiatry

## 2017-08-16 NOTE — Telephone Encounter (Signed)
-----   Message from Lesly DukesKelly H Leggett, MD sent at 08/13/2017  4:08 PM EST ----- CIN 3 on [facility]--margins negative; needs co testing in 12 months   RN to Call

## 2017-08-16 NOTE — Telephone Encounter (Signed)
Pt notified of neg margins from LEEP and will return in 1 year for pap.

## 2017-09-01 ENCOUNTER — Encounter: Payer: Self-pay | Admitting: Obstetrics & Gynecology

## 2017-09-15 ENCOUNTER — Ambulatory Visit: Payer: 59 | Admitting: Obstetrics & Gynecology

## 2017-10-06 ENCOUNTER — Ambulatory Visit (INDEPENDENT_AMBULATORY_CARE_PROVIDER_SITE_OTHER): Payer: 59 | Admitting: Obstetrics & Gynecology

## 2017-10-06 ENCOUNTER — Encounter: Payer: Self-pay | Admitting: Obstetrics & Gynecology

## 2017-10-06 VITALS — Ht 69.0 in | Wt 266.0 lb

## 2017-10-06 DIAGNOSIS — L650 Telogen effluvium: Secondary | ICD-10-CM

## 2017-10-06 DIAGNOSIS — R87613 High grade squamous intraepithelial lesion on cytologic smear of cervix (HGSIL): Secondary | ICD-10-CM

## 2017-10-06 NOTE — Progress Notes (Signed)
   Subjective:    Patient ID: Destiny Joseph, female    DOB: 04/19/1981, 37 y.o.   MRN: [id number]  HPI  37 yo female presents s/p LEEP.  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  Pt has no recent discharge or bleeding.  Pt has resumed intercourse without problems.  [facility].  Pt c/o hair loss after child.  Worse the past 1-2 months which is expected.  Review of Systems  Gastrointestinal: Negative.   Genitourinary: Negative.   Skin:       Hair loss       Objective:   Physical Exam  Constitutional: She is oriented to person, place, and time. She appears well-developed and well-nourished. No distress.  HENT:  Head: Normocephalic and atraumatic.  Eyes: Conjunctivae are normal.  Pulmonary/Chest: Effort normal.  Abdominal: Soft. Bowel sounds are normal. There is no tenderness.  Genitourinary:  Genitourinary Comments: Normal cervix.  Well healed.  Musculoskeletal: She exhibits no edema.  Neurological: She is alert and oriented to person, place, and time.  Skin: Skin is warm and dry.  Psychiatric: She has a normal mood and affect.  Vitals reviewed.  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  Healed well.  Pap with cotesting in 12 months 2.  Telogen effluvium--Pt reassured.  If hair doesn't growing back in in next few months or hair loss worsens, can refer to dermatologist.

## 2018-07-26 ENCOUNTER — Ambulatory Visit: Payer: Self-pay | Admitting: Allergy and Immunology

## 2018-08-02 ENCOUNTER — Ambulatory Visit (INDEPENDENT_AMBULATORY_CARE_PROVIDER_SITE_OTHER): Payer: 59 | Admitting: Allergy and Immunology

## 2018-08-02 ENCOUNTER — Encounter: Payer: Self-pay | Admitting: Allergy and Immunology

## 2018-08-02 VITALS — BP 118/72 | HR 75 | Temp 98.0°F | Resp 18 | Wt 250.2 lb

## 2018-08-02 DIAGNOSIS — J310 Chronic rhinitis: Secondary | ICD-10-CM | POA: Diagnosis not present

## 2018-08-02 DIAGNOSIS — H1013 Acute atopic conjunctivitis, bilateral: Secondary | ICD-10-CM

## 2018-08-02 DIAGNOSIS — J3089 Other allergic rhinitis: Secondary | ICD-10-CM | POA: Diagnosis not present

## 2018-08-02 DIAGNOSIS — T485X5A Adverse effect of other anti-common-cold drugs, initial encounter: Secondary | ICD-10-CM

## 2018-08-02 DIAGNOSIS — H1010 Acute atopic conjunctivitis, unspecified eye: Secondary | ICD-10-CM | POA: Insufficient documentation

## 2018-08-02 MED ORDER — OLOPATADINE HCL 0.2 % OP SOLN: 1.0000 [drp] | Freq: Every day | OPHTHALMIC | 5 refills | Status: AC

## 2018-08-02 MED ORDER — FLUTICASONE PROPIONATE 93 MCG/ACT NA EXHU: 2.0000 | INHALANT_SUSPENSION | Freq: Two times a day (BID) | NASAL | 5 refills | Status: AC

## 2018-08-02 MED ORDER — CARBINOXAMINE MALEATE 4 MG PO TABS: 4.0000 mg | ORAL_TABLET | Freq: Four times a day (QID) | ORAL | 5 refills | Status: AC

## 2018-08-02 MED ORDER — AZELASTINE HCL 0.1 % NA SOLN: 1.0000 | Freq: Two times a day (BID) | NASAL | 5 refills | Status: AC

## 2018-08-02 NOTE — Assessment & Plan Note (Signed)
   Aeroallergen avoidance measures have been discussed and provided in written form.  A prescription has been provided for carbinoxamine 4 mg every 6-8 hours if needed.  Xhance and azelastine nasal spray have been prescribed (as above).  Nasal saline irrigation (as above).  If allergen avoidance measures and medications fail to adequately relieve symptoms, aeroallergen immunotherapy will be considered.

## 2018-08-02 NOTE — Assessment & Plan Note (Signed)
   Prednisone has been provided, 50 mg x3 days, 40 mg x 3 days, 20x 3 days, 10 mg x3 days, then stop.  A prescription has been provided for [facility], 2 actuations per nostril twice a day. Proper technique has been discussed and demonstrated.  A prescription has been provided for azelastine nasal spray, 1-2 sprays per nostril 2 times daily. Proper nasal spray technique has been discussed and demonstrated.   Nasal saline spray (i.e., Simply Saline) or nasal saline lavage (i.e., NeilMed) is recommended as needed and prior to medicated nasal sprays.

## 2018-08-02 NOTE — Assessment & Plan Note (Signed)
   Treatment plan as outlined above for allergic rhinitis.  A prescription has been provided for Pataday, one drop per eye daily as needed.  I have also recommended eye lubricant drops (i.e., Natural Tears) as needed. 

## 2018-08-02 NOTE — Patient Instructions (Addendum)
Rhinitis medicamentosa  Prednisone has been provided, 50 mg x3 days, 40 mg x 3 days, 20x 3 days, 10 mg x3 days, then stop.  A prescription has been provided for [facility], 2 actuations per nostril twice a day. Proper technique has been discussed and demonstrated.  A prescription has been provided for azelastine nasal spray, 1-2 sprays per nostril 2 times daily. Proper nasal spray technique has been discussed and demonstrated.   Nasal saline spray (i.e., Simply Saline) or nasal saline lavage (i.e., NeilMed) is recommended as needed and prior to medicated nasal sprays.  Allergic rhinitis with a possible nonallergic component  Aeroallergen avoidance measures have been discussed and provided in written form.  A prescription has been provided for carbinoxamine 4 mg every 6-8 hours if needed.  Xhance and azelastine nasal spray have been prescribed (as above).  Nasal saline irrigation (as above).  If allergen avoidance measures and medications fail to adequately relieve symptoms, aeroallergen immunotherapy will be considered.  Allergic conjunctivitis  Treatment plan as outlined above for allergic rhinitis.  A prescription has been provided for Pataday, one drop per eye daily as needed.  I have also recommended eye lubricant drops (i.e., Natural Tears) as needed.   Return in about 3 months (around 11/02/2018), or if symptoms worsen or fail to improve.  Reducing Pollen Exposure  The American Academy of Allergy, Asthma and Immunology suggests the following steps to reduce your exposure to pollen during allergy seasons.    1. Do not hang sheets or clothing out to dry; pollen may collect on these items. 2. Do not mow lawns or spend time around freshly cut grass; mowing stirs up pollen. 3. Keep windows closed at night.  Keep car windows closed while driving. 4. Minimize morning activities outdoors, a time when pollen counts are usually at their highest. 5. Stay indoors as much as possible  when pollen counts or humidity is high and on windy days when pollen tends to remain in the air longer. 6. Use air conditioning when possible.  Many air conditioners have filters that trap the pollen spores. 7. Use a HEPA room air filter to remove pollen form the indoor air you breathe.   Control of Mold Allergen  Mold and fungi can grow on a variety of surfaces provided certain temperature and moisture conditions exist.  Outdoor molds grow on plants, decaying vegetation and soil.  The major outdoor mold, Alternaria and Cladosporium, are found in very high numbers during hot and dry conditions.  Generally, a late Summer - Fall peak is seen for common outdoor fungal spores.  Rain will temporarily lower outdoor mold spore count, but counts rise rapidly when the rainy period ends.  The most important indoor molds are Aspergillus and Penicillium.  Dark, humid and poorly ventilated basements are ideal sites for mold growth.  The next most common sites of mold growth are the bathroom and the kitchen.  Outdoor [employer] 1. Use air conditioning and keep windows closed 2. Avoid exposure to decaying vegetation. 3. Avoid leaf raking. 4. Avoid grain handling. 5. Consider wearing a face mask if working in moldy areas.  Indoor Mold Control 1. Maintain humidity below 50%. 2. Clean washable surfaces with 5% bleach solution. 3. Remove sources e.g. Contaminated carpets.  Control of Cockroach Allergen  Cockroach allergen has been identified as an important cause of acute attacks of asthma, especially in urban settings.  There are fifty-five species of cockroach that exist in the [place], however only three, the [place], [place]  and [place] species produce allergen that can affect patients with Asthma.  Allergens can be obtained from fecal particles, egg casings and secretions from cockroaches.    1. Remove food sources. 2. Reduce access to water. 3. Seal access and entry points. 4. Spray runways  with 0.5-1% Diazinon or Chlorpyrifos 5. Blow boric acid power under stoves and refrigerator. 6. Place bait stations (hydramethylnon) at feeding sites.

## 2018-08-02 NOTE — Progress Notes (Signed)
New Patient Note  RE: Destiny Joseph MRN: [id number] DOB: [birth year] Date of Office Visit: 08/02/2018  Referring provider: Tally Joe, MD Primary care provider: Tally Joe, MD  Chief Complaint: Nasal Congestion and Medication Management   History of present illness: Destiny Joseph is a 37 y.o. female seen today in consultation requested by Tally Joe, MD.  She reports that for many years she has experienced typical seasonal allergic rhinoconjunctivitis.  However, in January 2018, during her first trimester of pregnancy, her nasal allergy symptoms "went crazy."  In an attempt to control nasal congestion, rhinorrhea, sneezing, thick postnasal drainage, as well as occasional nasal pruritus and ocular pruritus, she started taking Afrin nasal spray on a daily basis.  She was unable to come off of the Afrin despite adding several other over-the-counter allergy medications, including cetirizine, loratadine, diphenhydramine, and fluticasone nasal spray.  She took a course of prednisone, however has gone back to using the Afrin and is unable to come off due to the rebound effect.  Her nasal allergy symptoms occur year around but tend to be more frequent and severe during the springtime and in the fall.  She has no history of symptoms consistent with asthma or eczema.  Assessment and plan: Rhinitis medicamentosa  Prednisone has been provided, 50 mg x3 days, 40 mg x 3 days, 20x 3 days, 10 mg x3 days, then stop.  A prescription has been provided for [facility], 2 actuations per nostril twice a day. Proper technique has been discussed and demonstrated.  A prescription has been provided for azelastine nasal spray, 1-2 sprays per nostril 2 times daily. Proper nasal spray technique has been discussed and demonstrated.   Nasal saline spray (i.e., Simply Saline) or nasal saline lavage (i.e., NeilMed) is recommended as needed and prior to medicated nasal sprays.  Allergic rhinitis with a  possible nonallergic component  Aeroallergen avoidance measures have been discussed and provided in written form.  A prescription has been provided for carbinoxamine 4 mg every 6-8 hours if needed.  Xhance and azelastine nasal spray have been prescribed (as above).  Nasal saline irrigation (as above).  If allergen avoidance measures and medications fail to adequately relieve symptoms, aeroallergen immunotherapy will be considered.  Allergic conjunctivitis  Treatment plan as outlined above for allergic rhinitis.  A prescription has been provided for Pataday, one drop per eye daily as needed.  I have also recommended eye lubricant drops (i.e., Natural Tears) as needed.   Meds ordered this encounter  Medications  . Fluticasone Propionate (XHANCE) 93 MCG/ACT EXHU    Sig: Place 2 sprays into the nose 2 (two) times daily.    Dispense:  32 mL    Refill:  5    [phone] (M)  . azelastine (ASTELIN) 0.1 % nasal spray    Sig: Place 1-2 sprays into both nostrils 2 (two) times daily.    Dispense:  30 mL    Refill:  5  . Carbinoxamine Maleate 4 MG TABS    Sig: Take 1 tablet (4 mg total) by mouth every 6 (six) hours.    Dispense:  60 each    Refill:  5  . Olopatadine HCl (PATADAY) 0.2 % SOLN    Sig: Place 1 drop into both eyes daily.    Dispense:  1 Bottle    Refill:  5    Diagnostics: Epicutaneous testing: Negative despite a positive histamine control. Intradermal testing: Positive to grass pollen, molds, and cockroach antigen.    Physical examination: Blood  pressure 118/72, pulse 75, temperature 98 F (36.7 C), temperature source Oral, resp. rate 18, weight 250 lb 4 oz (113.5 kg), SpO2 98 %, not currently breastfeeding.  General: Alert, interactive, in no acute distress. HEENT: TMs pearly gray, turbinates edematous with clear discharge, post-pharynx moderately erythematous. Neck: Supple without lymphadenopathy. Lungs: Clear to auscultation without wheezing, rhonchi or  rales. CV: Normal S1, S2 without murmurs. Abdomen: Nondistended, nontender. Skin: Warm and dry, without lesions or rashes. Extremities:  No clubbing, cyanosis or edema. Neuro:   Grossly intact.  Review of systems:  Review of systems negative except as noted in HPI / PMHx or noted below: Review of Systems  Constitutional: Negative.   HENT: Negative.   Eyes: Negative.   Respiratory: Negative.   Cardiovascular: Negative.   Gastrointestinal: Negative.   Genitourinary: Negative.   Musculoskeletal: Negative.   Skin: Negative.   Neurological: Negative.   Endo/Heme/Allergies: Negative.   Psychiatric/Behavioral: Negative.     Past medical history:  Past Medical History:  Diagnosis Date  . Anxiety   . Vaginal Pap smear, abnormal    Required cryotherapy    Past surgical history:  Past Surgical History:  Procedure Laterality Date  . DILATION AND CURETTAGE OF UTERUS    . foot cyst removal Left   . TUBAL LIGATION Bilateral 06/13/2017   Procedure: POST PARTUM TUBAL LIGATION;  Surgeon: Brisbane Bing, MD;  Location: [facility];  Service: Gynecology;  Laterality: Bilateral;    Family history: Family History  Problem Relation Age of Onset  . Breast cancer Mother 33  . Ovarian cysts Mother   . Hypertension Father   . Diabetes Father   . Bipolar disorder Sister   . Breast cancer Maternal Aunt 51    Social history: Social History   Socioeconomic History  . Marital status: Married    Spouse name: Not on file  . Number of children: Not on file  . Years of education: Not on file  . Highest education level: Not on file  Occupational History  . Occupation: Environmental manager  Social Needs  . Financial resource strain: Not on file  . Food insecurity:    Worry: Not on file    Inability: Not on file  . Transportation needs:    Medical: Not on file    Non-medical: Not on file  Tobacco Use  . Smoking status: Never Smoker  . Smokeless tobacco: Never Used  Substance and Sexual Activity   . Alcohol use: No  . Drug use: No  . Sexual activity: Not Currently    Birth control/protection: Surgical  Lifestyle  . Physical activity:    Days per week: Not on file    Minutes per session: Not on file  . Stress: Not on file  Relationships  . Social connections:    Talks on phone: Not on file    Gets together: Not on file    Attends religious service: Not on file    Active member of club or organization: Not on file    Attends meetings of clubs or organizations: Not on file    Relationship status: Not on file  . Intimate partner violence:    Fear of current or ex partner: Not on file    Emotionally abused: Not on file    Physically abused: Not on file    Forced sexual activity: Not on file  Other Topics Concern  . Not on file  Social History Narrative  . Not on file   Environmental History: Patient lives  in a 37 year old house with hardwood floors throughout and central air/heat.  There are 2 dogs and 2 birds in the house which do not have access to her bedroom.  There is no known mold/water damage in the home.  She is a non-smoker.  Allergies as of 08/02/2018   No Known Allergies     Medication List        Accurate as of 08/02/18 11:10 AM. Always use your most recent med list.          AFRIN SINUS NA Place 1-2 sprays into the nose daily.   azelastine 0.1 % nasal spray Commonly known as:  ASTELIN Place 1-2 sprays into both nostrils 2 (two) times daily.   Carbinoxamine Maleate 4 MG Tabs Take 1 tablet (4 mg total) by mouth every 6 (six) hours.   cetirizine 10 MG tablet Commonly known as:  ZYRTEC Take 10 mg by mouth daily.   chlorpheniramine 4 MG tablet Commonly known as:  CHLOR-TRIMETON Take 4 mg by mouth 2 (two) times daily as needed for allergies.   Fluticasone Propionate 93 MCG/ACT Exhu Place 2 sprays into the nose 2 (two) times daily.   Olopatadine HCl 0.2 % Soln Place 1 drop into both eyes daily.   QC FEXOFENADINE HYDROCHLORIDE 180 MG  tablet Generic drug:  fexofenadine Take 180 mg by mouth daily.       Known medication allergies: No Known Allergies  I appreciate the opportunity to take part in Sonyia's care. Please do not hesitate to contact me with questions.  Sincerely,   R. Jorene Guest, MD

## 2018-11-08 ENCOUNTER — Ambulatory Visit: Payer: 59 | Admitting: Allergy and Immunology

## 2020-08-20 ENCOUNTER — Telehealth (HOSPITAL_COMMUNITY): Payer: Self-pay

## 2020-08-20 NOTE — Telephone Encounter (Signed)
Called to Discuss with patient about Covid symptoms and the use of the monoclonal antibody infusion for those with mild to moderate Covid symptoms and at a high risk of hospitalization.     Pt appears to qualify for this infusion due to co-morbid conditions and/or a member of an at-risk group in accordance with the FDA Emergency Use Authorization.    Pt stated her symptoms started on 08/16/20, she tested positive with a home test and was tested at [facility] today, 11/16, both positive. She states her symptoms include cough, congestion, headache, and loss of taste and smell. Her qualifying risk factor for treatment is a previous smoker. Pt informed an APP will follow to verify information and set up an appointment.

## 2020-08-21 ENCOUNTER — Telehealth (HOSPITAL_COMMUNITY): Payer: Self-pay | Admitting: Family

## 2020-08-21 DIAGNOSIS — U071 COVID-19: Secondary | ICD-10-CM

## 2020-08-21 NOTE — Telephone Encounter (Signed)
Called to discuss with Destiny Joseph about Covid symptoms and potential candidacy for the use of sotrovimab, a combination monoclonal antibody infusion for those with mild to moderate Covid symptoms and at a high risk of hospitalization.     Pt is qualified for this infusion at the infusion center due to co-morbid conditions and/or a member of an at-risk group, however patient states she and her husband go the infusion elsewhere this morning.   Kahmari Koller,NP

## 2021-06-03 ENCOUNTER — Other Ambulatory Visit: Payer: Self-pay | Admitting: Podiatry

## 2021-06-03 DIAGNOSIS — M79671 Pain in right foot: Secondary | ICD-10-CM

## 2021-07-05 ENCOUNTER — Other Ambulatory Visit: Payer: No Typology Code available for payment source

## 2024-11-16 ENCOUNTER — Ambulatory Visit: Admitting: Physical Therapy
